# Patient Record
Sex: Female | Born: 1975
Health system: Southern US, Community
[De-identification: ages and names within clinical notes are randomized; demographics above are authoritative.]

## PROBLEM LIST (undated history)

## (undated) DIAGNOSIS — I10 Essential (primary) hypertension: Secondary | ICD-10-CM

## (undated) DIAGNOSIS — R569 Unspecified convulsions: Secondary | ICD-10-CM

## (undated) DIAGNOSIS — F419 Anxiety disorder, unspecified: Secondary | ICD-10-CM

## (undated) HISTORY — PX: NO PAST SURGERIES: SHX2092

## (undated) HISTORY — DX: Anxiety disorder, unspecified: F41.9

---

## 1998-02-27 ENCOUNTER — Inpatient Hospital Stay (HOSPITAL_COMMUNITY): Admission: AD | Admit: 1998-02-27 | Discharge: 1998-02-27 | Payer: Self-pay | Admitting: Obstetrics and Gynecology

## 1998-03-02 ENCOUNTER — Inpatient Hospital Stay (HOSPITAL_COMMUNITY): Admission: AD | Admit: 1998-03-02 | Discharge: 1998-03-02 | Payer: Self-pay | Admitting: Unknown Physician Specialty

## 1998-03-22 ENCOUNTER — Other Ambulatory Visit: Admission: RE | Admit: 1998-03-22 | Discharge: 1998-03-22 | Payer: Self-pay | Admitting: Obstetrics and Gynecology

## 1998-06-18 ENCOUNTER — Emergency Department (HOSPITAL_COMMUNITY): Admission: EM | Admit: 1998-06-18 | Discharge: 1998-06-18 | Payer: Self-pay | Admitting: Family Medicine

## 1998-09-04 ENCOUNTER — Inpatient Hospital Stay (HOSPITAL_COMMUNITY): Admission: AD | Admit: 1998-09-04 | Discharge: 1998-09-04 | Payer: Self-pay | Admitting: Obstetrics and Gynecology

## 1998-09-19 ENCOUNTER — Inpatient Hospital Stay (HOSPITAL_COMMUNITY): Admission: AD | Admit: 1998-09-19 | Discharge: 1998-09-19 | Payer: Self-pay | Admitting: *Deleted

## 1998-09-21 ENCOUNTER — Inpatient Hospital Stay (HOSPITAL_COMMUNITY): Admission: AD | Admit: 1998-09-21 | Discharge: 1998-09-21 | Payer: Self-pay | Admitting: Obstetrics and Gynecology

## 1998-09-22 ENCOUNTER — Inpatient Hospital Stay: Admission: AD | Admit: 1998-09-22 | Discharge: 1998-09-22 | Payer: Self-pay | Admitting: Obstetrics & Gynecology

## 1998-09-24 ENCOUNTER — Inpatient Hospital Stay (HOSPITAL_COMMUNITY): Admission: AD | Admit: 1998-09-24 | Discharge: 1998-09-24 | Payer: Self-pay | Admitting: Obstetrics and Gynecology

## 1998-09-25 ENCOUNTER — Inpatient Hospital Stay (HOSPITAL_COMMUNITY): Admission: AD | Admit: 1998-09-25 | Discharge: 1998-09-28 | Payer: Self-pay | Admitting: Obstetrics & Gynecology

## 1998-09-29 ENCOUNTER — Inpatient Hospital Stay (HOSPITAL_COMMUNITY): Admission: AD | Admit: 1998-09-29 | Discharge: 1998-09-29 | Payer: Self-pay | Admitting: Obstetrics and Gynecology

## 1998-11-01 ENCOUNTER — Other Ambulatory Visit: Admission: RE | Admit: 1998-11-01 | Discharge: 1998-11-01 | Payer: Self-pay | Admitting: Obstetrics and Gynecology

## 1999-01-23 ENCOUNTER — Emergency Department (HOSPITAL_COMMUNITY): Admission: EM | Admit: 1999-01-23 | Discharge: 1999-01-23 | Payer: Self-pay | Admitting: Emergency Medicine

## 1999-11-06 ENCOUNTER — Other Ambulatory Visit: Admission: RE | Admit: 1999-11-06 | Discharge: 1999-11-06 | Payer: Self-pay | Admitting: Obstetrics and Gynecology

## 2000-12-18 ENCOUNTER — Other Ambulatory Visit: Admission: RE | Admit: 2000-12-18 | Discharge: 2000-12-18 | Payer: Self-pay | Admitting: Obstetrics and Gynecology

## 2001-02-23 ENCOUNTER — Other Ambulatory Visit: Admission: RE | Admit: 2001-02-23 | Discharge: 2001-02-23 | Payer: Self-pay | Admitting: Obstetrics and Gynecology

## 2001-06-11 ENCOUNTER — Emergency Department (HOSPITAL_COMMUNITY): Admission: EM | Admit: 2001-06-11 | Discharge: 2001-06-11 | Payer: Self-pay | Admitting: Emergency Medicine

## 2001-10-25 ENCOUNTER — Other Ambulatory Visit: Admission: RE | Admit: 2001-10-25 | Discharge: 2001-10-25 | Payer: Self-pay | Admitting: Obstetrics and Gynecology

## 2001-11-11 ENCOUNTER — Emergency Department (HOSPITAL_COMMUNITY): Admission: EM | Admit: 2001-11-11 | Discharge: 2001-11-11 | Payer: Self-pay | Admitting: Emergency Medicine

## 2002-07-25 ENCOUNTER — Other Ambulatory Visit: Admission: RE | Admit: 2002-07-25 | Discharge: 2002-07-25 | Payer: Self-pay | Admitting: Obstetrics and Gynecology

## 2003-02-21 ENCOUNTER — Encounter: Admission: RE | Admit: 2003-02-21 | Discharge: 2003-02-21 | Payer: Self-pay | Admitting: Cardiology

## 2003-02-21 ENCOUNTER — Encounter: Payer: Self-pay | Admitting: Cardiology

## 2016-12-17 LAB — HM MAMMOGRAPHY

## 2017-07-27 ENCOUNTER — Encounter (HOSPITAL_COMMUNITY): Payer: Self-pay | Admitting: Emergency Medicine

## 2017-07-27 ENCOUNTER — Ambulatory Visit (HOSPITAL_COMMUNITY)
Admission: EM | Admit: 2017-07-27 | Discharge: 2017-07-27 | Disposition: A | Discharge status: Home / Self Care | Payer: Self-pay | Attending: Emergency Medicine | Admitting: Emergency Medicine

## 2017-07-27 DIAGNOSIS — I10 Essential (primary) hypertension: Secondary | ICD-10-CM

## 2017-07-27 DIAGNOSIS — J0110 Acute frontal sinusitis, unspecified: Secondary | ICD-10-CM

## 2017-07-27 HISTORY — DX: Essential (primary) hypertension: I10

## 2017-07-27 MED ORDER — DOXYCYCLINE HYCLATE 100 MG PO CAPS: 100.0000 mg | ORAL_CAPSULE | Freq: Two times a day (BID) | ORAL | 0 refills | Status: AC

## 2017-07-27 MED ORDER — IBUPROFEN 600 MG PO TABS: 600.0000 mg | ORAL_TABLET | Freq: Four times a day (QID) | ORAL | 0 refills | Status: DC | PRN

## 2017-07-27 MED ORDER — FLUTICASONE PROPIONATE 50 MCG/ACT NA SUSP: 2.0000 | Freq: Every day | NASAL | 0 refills | Status: DC

## 2017-07-27 NOTE — ED Provider Notes (Signed)
HPI  SUBJECTIVE:  Faith Oconnor is a 41 y.o. female who presents with 10 days of yellow nasal congestion, rhinorrhea, postnasal drip, frontal headaches, sinus pain or pressure and right ear pain.  She reports a source, scratchy, irritated throat.  She has tried Advil sinus and Benadryl cold and sinus.  The Advil sinus helps.  Symptoms are worse with lying down.  No fevers, facial swelling, upper dental pain.  No change in hearing, otorrhea.  She has a past medical history of hypertension, states that she has not taken her blood pressure medicines today.  States it is normally well controlled.  Also history of anxiety, sinusitis.  This is identical to previous episodes of sinusitis.  No history of diabetes.  LMP: Mirena.  PMD: None.  Past Medical History:  Diagnosis Date  . Hypertension     History reviewed. No pertinent surgical history.  No family history on file.  Social History  Substance Use Topics  . Smoking status: Never Smoker  . Smokeless tobacco: Never Used  . Alcohol use No    No current facility-administered medications for this encounter.   Current Outpatient Prescriptions:  .  lisinopril (PRINIVIL,ZESTRIL) 10 MG tablet, Take 10 mg by mouth daily., Disp: , Rfl:  .  doxycycline (VIBRAMYCIN) 100 MG capsule, Take 1 capsule (100 mg total) by mouth 2 (two) times daily., Disp: 14 capsule, Rfl: 0 .  fluticasone (FLONASE) 50 MCG/ACT nasal spray, Place 2 sprays into both nostrils daily., Disp: 16 g, Rfl: 0 .  ibuprofen (ADVIL,MOTRIN) 600 MG tablet, Take 1 tablet (600 mg total) by mouth every 6 (six) hours as needed., Disp: 30 tablet, Rfl: 0  Allergies  Allergen Reactions  . Penicillins      ROS  As noted in HPI.   Physical Exam  BP (!) 174/88 (BP Location: Right Arm)   Pulse 76   Temp 98.1 F (36.7 C) (Oral)   Resp 18   SpO2 100%   Constitutional: Well developed, well nourished, no acute distress Eyes:  EOMI, conjunctiva normal bilaterally HENT: Normocephalic,  atraumatic,mucus membranes moist. TMs normal bilaterally. + purulent nasal congestion. Swollen red turbinates. - maxillary sinus tenderness, +  frontal sinus tenderness. Oropharynx normal + postnasal drip.  Respiratory: Normal inspiratory effort Cardiovascular: Normal rate GI: nondistended skin: No rash, skin intact Musculoskeletal: no deformities Neurologic: Alert & oriented x 3, no focal neuro deficits Psychiatric: Speech and behavior appropriate   ED Course   Medications - No data to display  No orders of the defined types were placed in this encounter.   No results found for this or any previous visit (from the past 24 hour(s)). No results found.  ED Clinical Impression  Hypertension, unspecified type  Acute frontal sinusitis, recurrence not specified   ED Assessment/Plan   Pt with indications for abx given duration of symptoms. Will start doxycycline in addition to nasal steriods, plain Mucinex, saline nasal irrigation, increase fluids, tylenol/motrin prn pain.  Follow-up with primary care physician of choice, providing primary care referral list.  Discussed MDM and plan with pt. Discussed sn/sx that should prompt return to the  ED. Pt agrees with plan and will f/u with PMD of choice as needed prn.   Pt hypertensive today.  Has not taken BP meds today and has been taking OTC decongestants. Pt has no historical evidence of end organ damage. Pt denies any CNS type sx such as HA, visual changes, focal paresis, or new onset seizure activity. Pt denies any CV sx such  as CP, dyspnea, palpitations, pedal edema, tearing pain radiating to back or abd. Pt denied any renal sx such as anuria or hematuria.  Elevated blood pressure most likely from decongestions, so we will have patient discontinue her over-the-counter cold medications and start on Tylenol/ibuprofen/plain Mucinex/saline nasal irrigation/Flonase.  She will keep a log of her blood pressure, she is to measure it once every day  at the same time, and bring this to her next primary care visit.  Providing primary care referral list.  Gave her strict ER return precautions.   *This clinic note was created using Dragon dictation software. Therefore, there may be occasional mistakes despite careful proofreading.  ?    Domenick GongMortenson, Chazlyn Cude, MD 07/28/17 [phone]

## 2017-07-27 NOTE — Discharge Instructions (Signed)
Take the medication as written.  Stop all other cold medications.  Start plain Mucinex. You may take 600 mg of motrin with 1 gram of tylenol up to 3-4 times a day as needed for pain. This is an effective combination for pain.   Use a neti pot or the NeilMed sinus rinse as often as you want to to reduce nasal congestion. Follow the directions on the box.   Decrease your salt intake. diet and exercise will lower your blood pressure significantly. It is important to keep your blood pressure under good control, as having a elevated for prolonged periods of time significantly increases your risk of stroke, heart attacks, kidney damage, eye damage, and other problems. Measure your blood pressure once a day, preferably at the same time every day. Keep a log of this and bring it to your next doctor's appointment. Return here in two weeks for blood pressure recheck if you're able to find a primary care physician by then. Return immediately to the ER if you start having chest pain, headache, problems seeing, problems talking, problems walking, if you feel like you're about to pass out, if you do pass out, if you have a seizure, or for any other concerns.  Below is a list of primary care practices who are taking new patients for you to follow-up with. [facility] [address] [place] [address] [phone]  [facility] [phone] [address] [place] [address]  [facility]: [address] [place] [id number]  [phone]  [facility]: [address] [place] [id number]   [phone]  [facility]: [address] [place] [address]  [phone]  [facility] : [address] [place] [id number] [phone]  [facility]: [address] [place] [phone]  [facility]: [address] [place] [id number] [phone]  Dr. Oneal GroutMahima Pandey [address] [facility] [place] [id number]  [phone]  Dr. Jackie PlumGeorge Osei-Bonsu, [facility]. [address] [place], [place] [address]  [phone]  Go to www.goodrx.com to look up your medications. This will give you a list of where you can find your prescriptions at the most affordable prices. Or ask the pharmacist what the cash price is, or if they have any other discount programs available to help make your medication more affordable. This can be less expensive than what you would pay with insurance.

## 2017-07-27 NOTE — ED Triage Notes (Signed)
Pt sts nasal congestion x 3 days °

## 2017-11-10 ENCOUNTER — Telehealth: Payer: Self-pay | Admitting: Neurology

## 2017-11-10 NOTE — Telephone Encounter (Signed)
Kathie RhodesBetty or Diane, Dr. Haroldine Lawsrossley would like me to see this patient asap. She is a new referral. Looks like I have a 9am open on this  Thursday, its an emg slot but we can give it to her if she can make it. On February 21st I have a few open emg slots as well. Would you reach out to her please and offer these appointments to her to her?  Thanks   Bethany: fyi

## 2017-11-10 NOTE — Telephone Encounter (Signed)
[name], Dr. Crossley would like me to see this patient asap. She is a new referral. Looks like I have a 9am open on this  Thursday, its an emg slot but we can give it to her if she can make it. On February 21st I have a few open emg slots as well. Would you reach out to her please and offer these appointments to her to her?  Thanks   Bethany: fyi 

## 2017-11-19 ENCOUNTER — Ambulatory Visit: Payer: Self-pay | Admitting: Neurology

## 2017-11-24 ENCOUNTER — Encounter: Payer: Self-pay | Admitting: Neurology

## 2018-03-02 ENCOUNTER — Ambulatory Visit: Payer: Self-pay | Admitting: Physician Assistant

## 2018-03-09 ENCOUNTER — Encounter: Payer: Self-pay | Admitting: Physician Assistant

## 2018-03-09 ENCOUNTER — Ambulatory Visit (INDEPENDENT_AMBULATORY_CARE_PROVIDER_SITE_OTHER): Payer: No Typology Code available for payment source | Admitting: Physician Assistant

## 2018-03-09 ENCOUNTER — Other Ambulatory Visit: Payer: Self-pay

## 2018-03-09 VITALS — BP 140/104 | HR 97 | Temp 98.5°F | Resp 17 | Ht 63.0 in | Wt 192.6 lb

## 2018-03-09 DIAGNOSIS — Z124 Encounter for screening for malignant neoplasm of cervix: Secondary | ICD-10-CM | POA: Diagnosis not present

## 2018-03-09 DIAGNOSIS — I10 Essential (primary) hypertension: Secondary | ICD-10-CM

## 2018-03-09 DIAGNOSIS — Z1231 Encounter for screening mammogram for malignant neoplasm of breast: Secondary | ICD-10-CM | POA: Diagnosis not present

## 2018-03-09 DIAGNOSIS — Z1239 Encounter for other screening for malignant neoplasm of breast: Secondary | ICD-10-CM

## 2018-03-09 DIAGNOSIS — M533 Sacrococcygeal disorders, not elsewhere classified: Secondary | ICD-10-CM | POA: Diagnosis not present

## 2018-03-09 MED ORDER — LISINOPRIL-HYDROCHLOROTHIAZIDE 10-12.5 MG PO TABS: 1.0000 | ORAL_TABLET | Freq: Every day | ORAL | 1 refills | Status: DC

## 2018-03-09 MED ORDER — MELOXICAM 7.5 MG PO TABS: 7.5000 mg | ORAL_TABLET | Freq: Every day | ORAL | 0 refills | Status: DC

## 2018-03-09 MED FILL — LISINOPRIL-HCTZ 10-12.5 MG: 10-12.5 | 30 days supply | Qty: 30 | Fill #0

## 2018-03-09 MED FILL — MELOXICAM 7.5 MG TABLET: 7.5 | 15 days supply | Qty: 15 | Fill #0
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
## 2018-03-09 NOTE — Assessment & Plan Note (Signed)
Declines exam. Order for screening mammogram placed.

## 2018-03-09 NOTE — Assessment & Plan Note (Signed)
BP above goal. Out of medication for > 2 weeks. Asymptomatic. Will restart her Lisinopril-HCTZ at prior dose. Start DASH diet. Recheck BP here in office in 1-2 weeks. Will adjust dose further if needed. 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

## 2018-03-09 NOTE — Patient Instructions (Signed)
Please restart your blood pressure medicine, taking as directed.  Try to follow the dietary recommendations below. We will recheck your BP in office here in a week or so.   Hold off on taking the Meloxicam until you have restarted your BP medications for a few days.  Limit heavy lifting or overexertion. Apply heating pad to the area for 10-15 minutes a few times per day. Get a donut cushion to use when having to sit for prolonged periods of time.   You will be contacted to schedule your mammogram. You will also be contacted to schedule an appointment with [facility].    DASH Eating Plan DASH stands for "Dietary Approaches to Stop Hypertension." The DASH eating plan is a healthy eating plan that has been shown to reduce high blood pressure (hypertension). It may also reduce your risk for type 2 diabetes, heart disease, and stroke. The DASH eating plan may also help with weight loss. What are tips for following this plan? General guidelines  Avoid eating more than 2,300 mg (milligrams) of salt (sodium) a day. If you have hypertension, you may need to reduce your sodium intake to 1,500 mg a day.  Limit alcohol intake to no more than 1 drink a day for nonpregnant women and 2 drinks a day for men. One drink equals 12 oz of beer, 5 oz of wine, or 1 oz of hard liquor.  Work with your health care provider to maintain a healthy body weight or to lose weight. Ask what an ideal weight is for you.  Get at least 30 minutes of exercise that causes your heart to beat faster (aerobic exercise) most days of the week. Activities may include walking, swimming, or biking.  Work with your health care provider or diet and nutrition specialist (dietitian) to adjust your eating plan to your individual calorie needs. Reading food labels  Check food labels for the amount of sodium per serving. Choose foods with less than 5 percent of the Daily Value of sodium. Generally, foods with less than 300 mg of  sodium per serving fit into this eating plan.  To find whole grains, look for the word "whole" as the first word in the ingredient list. Shopping  Buy products labeled as "low-sodium" or "no salt added."  Buy fresh foods. Avoid canned foods and premade or frozen meals. Cooking  Avoid adding salt when cooking. Use salt-free seasonings or herbs instead of table salt or sea salt. Check with your health care provider or pharmacist before using salt substitutes.  Do not fry foods. Cook foods using healthy methods such as baking, boiling, grilling, and broiling instead.  Cook with heart-healthy oils, such as olive, canola, soybean, or sunflower oil. Meal planning   Eat a balanced diet that includes: ? 5 or more servings of fruits and vegetables each day. At each meal, try to fill half of your plate with fruits and vegetables. ? Up to 6-8 servings of whole grains each day. ? Less than 6 oz of lean meat, poultry, or fish each day. A 3-oz serving of meat is about the same size as a deck of cards. One egg equals 1 oz. ? 2 servings of low-fat dairy each day. ? A serving of nuts, seeds, or beans 5 times each week. ? Heart-healthy fats. Healthy fats called Omega-3 fatty acids are found in foods such as flaxseeds and coldwater fish, like sardines, salmon, and mackerel.  Limit how much you eat of the following: ? Canned or  prepackaged foods. ? Food that is high in trans fat, such as fried foods. ? Food that is high in saturated fat, such as fatty meat. ? Sweets, desserts, sugary drinks, and other foods with added sugar. ? Full-fat dairy products.  Do not salt foods before eating.  Try to eat at least 2 vegetarian meals each week.  Eat more home-cooked food and less restaurant, buffet, and fast food.  When eating at a restaurant, ask that your food be prepared with less salt or no salt, if possible. What foods are recommended? The items listed may not be a complete list. Talk with your  dietitian about what dietary choices are best for you. Grains Whole-grain or whole-wheat bread. Whole-grain or whole-wheat pasta. Brown rice. [place]. Bulgur. Whole-grain and low-sodium cereals. Pita bread. Low-fat, low-sodium crackers. Whole-wheat flour tortillas. Vegetables Fresh or frozen vegetables (raw, steamed, roasted, or grilled). Low-sodium or reduced-sodium tomato and vegetable juice. Low-sodium or reduced-sodium tomato sauce and tomato paste. Low-sodium or reduced-sodium canned vegetables. Fruits All fresh, dried, or frozen fruit. Canned fruit in natural juice (without added sugar). Meat and other protein foods Skinless chicken or [place]. Ground chicken or [place]. Pork with fat trimmed off. Fish and seafood. Egg whites. Dried beans, peas, or lentils. Unsalted nuts, nut butters, and seeds. Unsalted canned beans. Lean cuts of beef with fat trimmed off. Low-sodium, lean deli meat. Dairy Low-fat (1%) or fat-free (skim) milk. Fat-free, low-fat, or reduced-fat cheeses. Nonfat, low-sodium ricotta or cottage cheese. Low-fat or nonfat yogurt. Low-fat, low-sodium cheese. Fats and oils Soft margarine without trans fats. Vegetable oil. Low-fat, reduced-fat, or light mayonnaise and salad dressings (reduced-sodium). Canola, safflower, olive, soybean, and sunflower oils. Avocado. Seasoning and other foods Herbs. Spices. Seasoning mixes without salt. Unsalted popcorn and pretzels. Fat-free sweets. What foods are not recommended? The items listed may not be a complete list. Talk with your dietitian about what dietary choices are best for you. Grains Baked goods made with fat, such as croissants, muffins, or some breads. Dry pasta or rice meal packs. Vegetables Creamed or fried vegetables. Vegetables in a cheese sauce. Regular canned vegetables (not low-sodium or reduced-sodium). Regular canned tomato sauce and paste (not low-sodium or reduced-sodium). Regular tomato and vegetable juice (not  low-sodium or reduced-sodium). Angie Fava. Olives. Fruits Canned fruit in a light or heavy syrup. Fried fruit. Fruit in cream or butter sauce. Meat and other protein foods Fatty cuts of meat. Ribs. Fried meat. Berniece Salines. Sausage. Bologna and other processed lunch meats. Salami. Fatback. Hotdogs. Bratwurst. Salted nuts and seeds. Canned beans with added salt. Canned or smoked fish. Whole eggs or egg yolks. Chicken or [place] with skin. Dairy Whole or 2% milk, cream, and half-and-half. Whole or full-fat cream cheese. Whole-fat or sweetened yogurt. Full-fat cheese. Nondairy creamers. Whipped toppings. Processed cheese and cheese spreads. Fats and oils Butter. Stick margarine. Lard. Shortening. Ghee. Bacon fat. Tropical oils, such as coconut, palm kernel, or palm oil. Seasoning and other foods Salted popcorn and pretzels. Onion salt, garlic salt, seasoned salt, table salt, and sea salt. Worcestershire sauce. Tartar sauce. Barbecue sauce. Teriyaki sauce. Soy sauce, including reduced-sodium. Steak sauce. Canned and packaged gravies. Fish sauce. Oyster sauce. Cocktail sauce. Horseradish that you find on the shelf. Ketchup. Mustard. Meat flavorings and tenderizers. Bouillon cubes. Hot sauce and Tabasco sauce. Premade or packaged marinades. Premade or packaged taco seasonings. Relishes. Regular salad dressings. Where to find more information:  National Heart, Lung, and [place]: https://wilson-eaton.com/  American Heart Association: www.heart.org Summary  The DASH  eating plan is a healthy eating plan that has been shown to reduce high blood pressure (hypertension). It may also reduce your risk for type 2 diabetes, heart disease, and stroke.  With the DASH eating plan, you should limit salt (sodium) intake to 2,300 mg a day. If you have hypertension, you may need to reduce your sodium intake to 1,500 mg a day.  When on the DASH eating plan, aim to eat more fresh fruits and vegetables, whole grains, lean proteins,  low-fat dairy, and heart-healthy fats.  Work with your health care provider or diet and nutrition specialist (dietitian) to adjust your eating plan to your individual calorie needs. This information is not intended to replace advice given to you by your health care provider. Make sure you discuss any questions you have with your health care provider. Document Released: 09/04/2011 Document Revised: 09/08/2016 Document Reviewed: 09/08/2016 Elsevier Interactive Patient Education  [employer].

## 2018-03-09 NOTE — Assessment & Plan Note (Signed)
Referral to [facility] placed per patient preference for routine care and PAP smear.

## 2018-03-19 ENCOUNTER — Other Ambulatory Visit: Payer: Self-pay | Admitting: Physician Assistant

## 2018-03-19 DIAGNOSIS — Z7689 Persons encountering health services in other specified circumstances: Secondary | ICD-10-CM

## 2018-03-25 ENCOUNTER — Other Ambulatory Visit: Payer: Self-pay | Admitting: Physician Assistant

## 2018-03-25 MED ORDER — FLUCONAZOLE 150 MG PO TABS: 150.0000 mg | ORAL_TABLET | Freq: Once | ORAL | 0 refills | Status: AC

## 2018-03-26 ENCOUNTER — Other Ambulatory Visit: Payer: Self-pay | Admitting: Physician Assistant

## 2018-03-26 MED ORDER — CELECOXIB 100 MG PO CAPS: 100.0000 mg | ORAL_CAPSULE | Freq: Two times a day (BID) | ORAL | 0 refills | Status: DC

## 2018-03-30 ENCOUNTER — Telehealth: Payer: No Typology Code available for payment source | Admitting: Nurse Practitioner

## 2018-03-30 DIAGNOSIS — B9789 Other viral agents as the cause of diseases classified elsewhere: Secondary | ICD-10-CM

## 2018-03-30 DIAGNOSIS — J329 Chronic sinusitis, unspecified: Secondary | ICD-10-CM

## 2018-03-30 MED ORDER — FLUTICASONE PROPIONATE 50 MCG/ACT NA SUSP: 2.0000 | Freq: Every day | NASAL | 0 refills | Status: DC

## 2018-03-30 NOTE — Progress Notes (Signed)

## 2018-03-31 MED FILL — NAPROXEN 500 MG TABLET: 500 | 10 days supply | Qty: 20 | Fill #0

## 2018-03-31 MED FILL — CYCLOBENZAPRINE 10 MG TAB: 10 | 10 days supply | Qty: 20 | Fill #0

## 2018-04-06 ENCOUNTER — Encounter: Payer: Self-pay | Admitting: Emergency Medicine

## 2018-04-22 ENCOUNTER — Encounter: Payer: Self-pay | Admitting: Physician Assistant

## 2018-04-26 NOTE — Telephone Encounter (Signed)
Ok to phone in refill of patient's lorazepam 0.5 mg tablet. Take 1 tablet by mouth up to BID PRN. Quantity 60 with 0 refills.

## 2018-04-29 MED ORDER — LORAZEPAM 0.5 MG PO TABS: 0.5000 mg | ORAL_TABLET | Freq: Two times a day (BID) | ORAL | 1 refills | Status: DC

## 2018-04-29 NOTE — Addendum Note (Signed)
Addended by: Waldon MerlMARTIN, Merci Walthers C on: 04/29/2018 11:24 AM   Modules accepted: Orders

## 2018-05-17 ENCOUNTER — Encounter (HOSPITAL_COMMUNITY): Payer: Self-pay | Admitting: Emergency Medicine

## 2018-05-17 ENCOUNTER — Ambulatory Visit (HOSPITAL_COMMUNITY)
Admission: EM | Admit: 2018-05-17 | Discharge: 2018-05-17 | Disposition: A | Discharge status: Home / Self Care | Payer: No Typology Code available for payment source | Attending: Family Medicine | Admitting: Family Medicine

## 2018-05-17 DIAGNOSIS — Z0289 Encounter for other administrative examinations: Secondary | ICD-10-CM

## 2018-05-17 DIAGNOSIS — S39012S Strain of muscle, fascia and tendon of lower back, sequela: Secondary | ICD-10-CM | POA: Diagnosis not present

## 2018-05-17 DIAGNOSIS — S39012D Strain of muscle, fascia and tendon of lower back, subsequent encounter: Secondary | ICD-10-CM

## 2018-05-17 HISTORY — DX: Strain of muscle, fascia and tendon of lower back, subsequent encounter: S39.012D

## 2018-05-17 NOTE — ED Provider Notes (Signed)
[facility]    CSN: [id number] Arrival date & time: 05/17/18  1221     History   Chief Complaint Chief Complaint  Patient presents with  . Letter for School/Work    HPI Faith Oconnor is a 42 y.o. female.   Patient is here today for a note to return to work after injuring her back about 6 months ago.  She was seen by medical provider at that time and told she had low back strain.  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  HPI  Past Medical History:  Diagnosis Date  . Anxiety   . Hypertension     Patient Active Problem List   Diagnosis Date Noted  . Essential hypertension 03/09/2018  . Breast cancer screening 03/09/2018  . Cervical cancer screening 03/09/2018  . Sacrodynia 03/09/2018    Past Surgical History:  Procedure Laterality Date  . NO PAST SURGERIES      OB History   None      Home Medications    Prior to Admission medications   Medication Sig Start Date End Date Taking? Authorizing Provider  celecoxib (CELEBREX) 100 MG capsule Take 1 capsule (100 mg total) by mouth 2 (two) times daily. 03/26/18   Waldon MerlMartin, William C, PA-C  cyclobenzaprine (FLEXERIL) 10 MG tablet Take by mouth. 02/06/18   [provider]  fluticasone (FLONASE) 50 MCG/ACT nasal spray Place 2 sprays into both nostrils daily. 03/30/18   Bennie PieriniMartin, Mary-Margaret, FNP  levonorgestrel (MIRENA) 20 MCG/24HR IUD 1 each by Intrauterine route once.    [provider]  lisinopril-hydrochlorothiazide (PRINZIDE,ZESTORETIC) 10-12.5 MG tablet Take 1 tablet by mouth daily. 03/09/18   Waldon MerlMartin, William C, PA-C  LORazepam (ATIVAN) 0.5 MG tablet Take 1 tablet (0.5 mg total) by mouth 2 (two) times daily. 04/29/18   Waldon MerlMartin, William C, PA-C    Family History Family History  Problem Relation Age of Onset  . Hypertension Mother   . Osteoarthritis Mother   . Hyperlipidemia Mother   . Hypertension Father   . Diabetes Mellitus II Father   .  Hyperlipidemia Father   . Hypertension Sister   . Sleep apnea Sister   . Depression Sister   . Breast cancer Maternal Grandmother 20  . Breast cancer Maternal Aunt 30       Metastasized to her bones  . Pancreatic cancer Maternal Aunt   . Depression Sister   . Hypertension Sister   . Epilepsy Daughter   . Migraines Daughter     Social History Social History   Tobacco Use  . Smoking status: Never Smoker  . Smokeless tobacco: Never Used  Substance Use Topics  . Alcohol use: No  . Drug use: No     Allergies   Penicillins   Review of Systems Review of Systems  Constitutional: Negative for chills and fever.  HENT: Negative for ear pain and sore throat.   Eyes: Negative for pain and visual disturbance.  Respiratory: Negative for cough and shortness of breath.   Cardiovascular: Negative for chest pain and palpitations.  Gastrointestinal: Negative for abdominal pain and vomiting.  Genitourinary: Negative for dysuria and hematuria.  Musculoskeletal: Negative for arthralgias and back pain.  Skin: Negative for color change and rash.  Neurological: Negative for seizures and syncope.  All other systems reviewed and are negative.    Physical Exam Triage Vital Signs ED Triage Vitals [05/17/18 1250]  Enc Vitals Group     BP 134/90  Pulse Rate 83     Resp 18     Temp 98.4 F (36.9 C)     Temp Source Oral     SpO2 100 %     Weight      Height      Head Circumference      Peak Flow      Pain Score      Pain Loc      Pain Edu?      Excl. in GC?    No data found.  Updated Vital Signs BP 134/90 (BP Location: Left Arm)   Pulse 83   Temp 98.4 F (36.9 C) (Oral)   Resp 18   SpO2 100%   Visual Acuity Right Eye Distance:   Left Eye Distance:   Bilateral Distance:    Right Eye Near:   Left Eye Near:    Bilateral Near:     Physical Exam  Constitutional: She appears well-developed and well-nourished.  Musculoskeletal: Normal range of motion.  Patient has  normal range of motion in her back. Deep tendon reflexes are symmetric at knees and ankles Straight leg raising is negative There is no tenderness palpation of spine. Strength is appropriate 5+ and equal both lower extremities.     UC Treatments / Results  Labs (all labs ordered are listed, but only abnormal results are displayed) Labs Reviewed - No data to display  EKG None  Radiology No results found.  Procedures Procedures (including critical care time)  Medications Ordered in UC Medications - No data to display  Initial Impression / Assessment and Plan / UC Course  I have reviewed the triage vital signs and the nursing notes.  Pertinent labs & imaging results that were available during my care of the patient were reviewed by me and considered in my medical decision making (see chart for details).     History of low back strain.  Patient cleared to return to work Final Clinical Impressions(s) / UC Diagnoses   Final diagnoses:  None   Discharge Instructions   None    ED Prescriptions    None     Controlled Substance Prescriptions [place] Controlled Substance Registry consulted? No   Frederica KusterMiller, Camiya Vinal M, MD 05/17/18 1259

## 2018-05-17 NOTE — ED Triage Notes (Signed)
Pt requesting note to return to work; pt has been out approx 6 months

## 2018-07-28 ENCOUNTER — Encounter: Payer: Self-pay | Admitting: Physician Assistant

## 2018-07-28 MED ORDER — CELECOXIB 100 MG PO CAPS: 100.0000 mg | ORAL_CAPSULE | Freq: Two times a day (BID) | ORAL | 0 refills | Status: DC

## 2018-07-28 MED ORDER — LORAZEPAM 0.5 MG PO TABS
0.5000 mg | ORAL_TABLET | Freq: Two times a day (BID) | ORAL | 1 refills | Status: DC
Start: 2018-07-28 — End: 2019-07-13

## 2019-02-08 ENCOUNTER — Encounter: Payer: Self-pay | Admitting: Emergency Medicine

## 2019-02-08 ENCOUNTER — Other Ambulatory Visit: Payer: Self-pay | Admitting: Physician Assistant

## 2019-02-08 NOTE — Telephone Encounter (Signed)
My chart message sent to patient advising of needing an appointment.

## 2019-03-12 ENCOUNTER — Emergency Department (HOSPITAL_COMMUNITY)
Admission: EM | Admit: 2019-03-12 | Discharge: 2019-03-12 | Disposition: A | Discharge status: Home / Self Care | Payer: BLUE CROSS/BLUE SHIELD | Attending: Emergency Medicine | Admitting: Emergency Medicine

## 2019-03-12 ENCOUNTER — Encounter (HOSPITAL_COMMUNITY): Payer: Self-pay | Admitting: Emergency Medicine

## 2019-03-12 ENCOUNTER — Emergency Department (HOSPITAL_COMMUNITY): Payer: BLUE CROSS/BLUE SHIELD

## 2019-03-12 ENCOUNTER — Other Ambulatory Visit: Payer: Self-pay

## 2019-03-12 DIAGNOSIS — F419 Anxiety disorder, unspecified: Secondary | ICD-10-CM | POA: Diagnosis not present

## 2019-03-12 DIAGNOSIS — R569 Unspecified convulsions: Secondary | ICD-10-CM | POA: Insufficient documentation

## 2019-03-12 DIAGNOSIS — I10 Essential (primary) hypertension: Secondary | ICD-10-CM | POA: Diagnosis not present

## 2019-03-12 LAB — CBC WITH DIFFERENTIAL/PLATELET
Abs Immature Granulocytes: 0.03 10*3/uL (ref 0.00–0.07)
Basophils Absolute: 0 10*3/uL (ref 0.0–0.1)
Basophils Relative: 0 %
Eosinophils Absolute: 0.2 10*3/uL (ref 0.0–0.5)
Eosinophils Relative: 2 %
HCT: 43.3 % (ref 36.0–46.0)
Hemoglobin: 13.4 g/dL (ref 12.0–15.0)
Immature Granulocytes: 0 %
Lymphocytes Relative: 29 %
Lymphs Abs: 2.2 10*3/uL (ref 0.7–4.0)
MCH: 26 pg (ref 26.0–34.0)
MCHC: 30.9 g/dL (ref 30.0–36.0)
MCV: 84.1 fL (ref 80.0–100.0)
Monocytes Absolute: 0.7 10*3/uL (ref 0.1–1.0)
Monocytes Relative: 9 %
Neutro Abs: 4.5 10*3/uL (ref 1.7–7.7)
Neutrophils Relative %: 60 %
Platelets: 269 10*3/uL (ref 150–400)
RBC: 5.15 MIL/uL — ABNORMAL HIGH (ref 3.87–5.11)
RDW: 13.5 % (ref 11.5–15.5)
WBC: 7.7 10*3/uL (ref 4.0–10.5)
nRBC: 0 % (ref 0.0–0.2)

## 2019-03-12 LAB — BASIC METABOLIC PANEL
Anion gap: 14 (ref 5–15)
BUN: 10 mg/dL (ref 6–20)
CO2: 19 mmol/L — ABNORMAL LOW (ref 22–32)
Calcium: 8.9 mg/dL (ref 8.9–10.3)
Chloride: 105 mmol/L (ref 98–111)
Creatinine, Ser: 1.18 mg/dL — ABNORMAL HIGH (ref 0.44–1.00)
GFR calc Af Amer: >60 mL/min (ref >60)
GFR calc non Af Amer: 56 mL/min — ABNORMAL LOW (ref >60)
Glucose, Bld: 86 mg/dL (ref 70–99)
Potassium: 3.3 mmol/L — ABNORMAL LOW (ref 3.5–5.1)
Sodium: 138 mmol/L (ref 135–145)

## 2019-03-12 LAB — I-STAT BETA HCG BLOOD, ED (MC, WL, AP ONLY): I-stat hCG, quantitative: <5 m[IU]/mL (ref <5)

## 2019-03-12 LAB — TSH: TSH: 0.824 u[IU]/mL (ref 0.350–4.500)

## 2019-03-12 LAB — CBG MONITORING, ED: Glucose-Capillary: 73 mg/dL (ref 70–99)

## 2019-03-12 LAB — PHOSPHORUS: Phosphorus: 2.2 mg/dL — ABNORMAL LOW (ref 2.5–4.6)

## 2019-03-12 LAB — D-DIMER, QUANTITATIVE: D-Dimer, Quant: 0.4 ug/mL-FEU (ref 0.00–0.50)

## 2019-03-12 LAB — MAGNESIUM: Magnesium: 1.9 mg/dL (ref 1.7–2.4)

## 2019-03-12 MED ORDER — SODIUM CHLORIDE 0.9 % IV BOLUS
1000.0000 mL | Freq: Once | INTRAVENOUS | Status: AC
  Administered 2019-03-12: 1000 mL via INTRAVENOUS

## 2019-03-12 MED ORDER — LEVETIRACETAM 500 MG PO TABS: 500.0000 mg | ORAL_TABLET | Freq: Two times a day (BID) | ORAL | 0 refills | Status: DC

## 2019-03-12 MED ORDER — LORAZEPAM 2 MG/ML IJ SOLN
1.0000 mg | Freq: Once | INTRAMUSCULAR | Status: AC
  Administered 2019-03-12: 1 mg via INTRAVENOUS
  Filled 2019-03-12: qty 1

## 2019-03-12 MED ORDER — LEVETIRACETAM IN NACL 1000 MG/100ML IV SOLN
1000.0000 mg | Freq: Once | INTRAVENOUS | Status: AC
  Administered 2019-03-12: 1000 mg via INTRAVENOUS
  Filled 2019-03-12: qty 100

## 2019-03-12 NOTE — ED Notes (Signed)
Updated patients husband on pt status.

## 2019-03-12 NOTE — ED Provider Notes (Signed)
[facility] Provider Note   CSN: [id number] Arrival date & time: 03/12/19  1424     History   Chief Complaint Chief Complaint  Patient presents with   Seizures    HPI Faith Oconnor is a 43 y.o. female past medical history of anxiety on prn ativan, hypertension presents emergency department today with chief complaint of seizure.  Onset was acute happening just prior to arrival.  Patient's husband states they were sitting in the drive-through at [facility] when she started to have full body shaking and then slumped over, he estimated this lasted a few minutes. EMS was called and on their arrival patient was alert and oriented to self only, she thought year was 2014 and that she was in a clinic. EMS did not give medications prior to ED arrival.   Pt reports history of seizures at age 65. She has not been on seizure medications since then or seen neurology. She does not remember the last time she took ativan, stating she only takes it as needed.    Past Medical History:  Diagnosis Date   Anxiety    Hypertension     Patient Active Problem List   Diagnosis Date Noted   Low back strain, subsequent encounter 05/17/2018   Essential hypertension 03/09/2018   Breast cancer screening 03/09/2018   Cervical cancer screening 03/09/2018   Sacrodynia 03/09/2018    Past Surgical History:  Procedure Laterality Date   NO PAST SURGERIES       OB History   No obstetric history on file.      Home Medications    Prior to Admission medications   Medication Sig Start Date End Date Taking? Authorizing Provider  celecoxib (CELEBREX) 100 MG capsule Take 1 capsule (100 mg total) by mouth 2 (two) times daily. 07/28/18   Brunetta Jeans, PA-C  cyclobenzaprine (FLEXERIL) 10 MG tablet Take by mouth. 02/06/18   [provider]  fluticasone (FLONASE) 50 MCG/ACT nasal spray Place 2 sprays into both nostrils daily. 03/30/18   Chevis Pretty, FNP  levonorgestrel (MIRENA) 20 MCG/24HR IUD 1 each by Intrauterine route once.    [provider]  lisinopril-hydrochlorothiazide (PRINZIDE,ZESTORETIC) 10-12.5 MG tablet Take 1 tablet by mouth daily. 03/09/18   Brunetta Jeans, PA-C  LORazepam (ATIVAN) 0.5 MG tablet Take 1 tablet (0.5 mg total) by mouth 2 (two) times daily. 07/28/18   Brunetta Jeans, PA-C    Family History Family History  Problem Relation Age of Onset   Hypertension Mother    Osteoarthritis Mother    Hyperlipidemia Mother    Hypertension Father    Diabetes Mellitus II Father    Hyperlipidemia Father    Hypertension Sister    Sleep apnea Sister    Depression Sister    Breast cancer Maternal Grandmother 20   Breast cancer Maternal Aunt 87       Metastasized to her bones   Pancreatic cancer Maternal Aunt    Depression Sister    Hypertension Sister    Epilepsy Daughter    Migraines Daughter     Social History Social History   Tobacco Use   Smoking status: Never Smoker   Smokeless tobacco: Never Used  Substance Use Topics   Alcohol use: No   Drug use: No     Allergies   Penicillins   Review of Systems Review of Systems  Constitutional: Negative for chills and fever.  HENT: Negative for congestion, ear discharge, ear pain, sinus pressure,  sinus pain and sore throat.   Eyes: Negative for pain and redness.  Respiratory: Negative for cough and shortness of breath.   Cardiovascular: Negative for chest pain.  Gastrointestinal: Negative for abdominal pain, constipation, diarrhea, nausea and vomiting.  Genitourinary: Negative for dysuria and hematuria.  Musculoskeletal: Negative for back pain and neck pain.  Skin: Negative for wound.  Neurological: Positive for seizures. Negative for weakness, numbness and headaches.     Physical Exam Updated Vital Signs BP (!) 159/101    Pulse (!) 106    Temp 98.5 F (36.9 C) (Oral)    Resp (!) 26    SpO2 98%    Physical Exam Vitals signs and nursing note reviewed.  Constitutional:      General: She is not in acute distress.    Appearance: She is not ill-appearing.  HENT:     Head: Normocephalic and atraumatic.     Right Ear: Tympanic membrane and external ear normal.     Left Ear: Tympanic membrane and external ear normal.     Nose: Nose normal.     Mouth/Throat:     Mouth: Mucous membranes are moist.     Pharynx: Oropharynx is clear.   Eyes:     General: No scleral icterus.       Right eye: No discharge.        Left eye: No discharge.     Extraocular Movements: Extraocular movements intact.     Conjunctiva/sclera: Conjunctivae normal.     Pupils: Pupils are equal, round, and reactive to light.  Neck:     Musculoskeletal: Normal range of motion.     Vascular: No JVD.  Cardiovascular:     Rate and Rhythm: Regular rhythm. Tachycardia present.     Pulses: Normal pulses.          Radial pulses are 2+ on the right side and 2+ on the left side.     Heart sounds: Normal heart sounds.  Pulmonary:     Comments: Lungs clear to auscultation in all fields. Symmetric chest rise. No wheezing, rales, or rhonchi. Abdominal:     Comments: Abdomen is soft, non-distended, and non-tender in all quadrants. No rigidity, no guarding. No peritoneal signs.  Musculoskeletal: Normal range of motion.     Comments: No midline tenderness, full ROM of all extremities.  Skin:    General: Skin is warm and dry.     Capillary Refill: Capillary refill takes less than 2 seconds.  Neurological:     Mental Status: She is oriented to person, place, and time.     GCS: GCS eye subscore is 4. GCS verbal subscore is 5. GCS motor subscore is 6.     Comments: Pt is alert and oriented to self and year.  Speech is clear and goal oriented, follows commands CN III-XII intact, no facial droop Normal strength in upper and lower extremities bilaterally including dorsiflexion and plantar flexion, strong and equal grip strength.  Sensation normal to light and sharp touch Moves extremities without ataxia, coordination intact Normal finger to nose and rapid alternating movements    Psychiatric:        Behavior: Behavior normal.      ED Treatments / Results  Labs (all labs ordered are listed, but only abnormal results are displayed) Labs Reviewed  BASIC METABOLIC PANEL  CBC WITH DIFFERENTIAL/PLATELET  CBG MONITORING, ED  I-STAT BETA HCG BLOOD, ED ([facility], [facility], [facility] ONLY)    EKG    Radiology No results found.  Procedures Procedures (including critical care time)  Medications Ordered in ED Medications  sodium chloride 0.9 % bolus 1,000 mL (has no administration in time range)     Initial Impression / Assessment and Plan / ED Course  I have reviewed the triage vital signs and the nursing notes.  Pertinent labs & imaging results that were available during my care of the patient were reviewed by me and considered in my medical decision making (see chart for details).  43 yo female presents after witnessed seizure. On arrival she is tachycardic and hypertensive. She is alert to self and year only, bite marks on tongue. 1 mg ativan given as suspect withdrawal. Ct head ordered. Labs show elevated creatinine of 1.18, do not have prior to compare. CBC unremarkable.   Patient care transferred to A. Harris PA-C at the end of my shift. Patient presentation, ED course, and plan of care discussed with review of all pertinent labs and imaging. Please see her note for further details regarding further ED course and disposition. If head CT negative and vitals improve, suspect pt will be disharged home with seizure precautions and neurology follow up.   Final Clinical Impressions(s) / ED Diagnoses   Final diagnoses:  None    ED Discharge Orders    None       Sherene Sireslbrizze, Jailynn Lavalais E, PA-C 03/12/19 1609    Margarita Grizzleay, Danielle, MD 03/13/19 1345

## 2019-03-12 NOTE — ED Notes (Signed)
Pt discharged with all belongings. Discharge instructions reviewed with pt, and pt verbalized understanding. Opportunity for questions provided.  

## 2019-03-12 NOTE — ED Triage Notes (Addendum)
Pt here c/o first time seizure while sitting  In her car waiting on food , pt arrived alert and oriented times 1

## 2019-03-12 NOTE — ED Notes (Signed)
Patient transported to CT 

## 2019-03-12 NOTE — ED Notes (Signed)
Pt ambulated to bathroom with assistance.

## 2019-03-12 NOTE — ED Provider Notes (Signed)
Seizure today at [facility] in car at drive through Lasted a few minutes- tonic clonic - post ictal Tongue biting. hx of seizure at 43 years of age.  Awaiting  A CT head.   60:75 PM   43 year old female with new onset seizure.  Patient does take Ativan for anxiety but states that she only takes it about once a week if she has an anxiety attack.  She did take it today just prior to the onset of her seizure. As I went to discuss the findings with the patient and question her medication usage noticed that she continues to be tachycardic and on the monitor was tachycardic to 121.  I confirmed this with a manual pulse.  I discussed the case with Dr. Darl Householder.  The patient will get another liter bolus of fluid.  We will check a d-dimer and a TSH level.   Clinical Course as of Mar 11 2257  Sat Mar 12, 2019  1920 Spoke with Dr. Cheral Marker about this patient who suggest that we add on a magnesium and a phosphorus level as well as a 2 g Keppra loading dose.  Should her vitals normalized she can be discharged with 500 mg Keppra twice daily, outpatient neuro follow-up with MRI and EEG.   [AH]    Clinical Course User Index [AH] Margarita Mail, PA-C     Patient with mildly decreased phosphorus level.  I discussed this with Dr. Cheral Marker he states that this has not been the low enough to cause her seizures.  After Keppra loading dose, 2 L of fluids patient's vital signs have normalized.  She is feeling much better.  She will be started on Keppra 500 mg twice daily.  She will need outpatient follow-up with neurology and I have given her the information.  She was given [place] driving restriction precautions for her seizure and understands that she is unable to drive for the next 6 months.  Patient appears appropriate for discharge at this time.      Margarita Mail, PA-C 03/12/19 2258    Drenda Freeze, MD 03/13/19 1520

## 2019-03-12 NOTE — Discharge Instructions (Addendum)
Please begin taking this antiseizure medication called Keppra.  You will need to follow-up with a neurologist for outpatient EEG testing and MRI. Please do not drive for the next 6 months or until you are cleared by your neurologist per [facility] guidelines. Please make sure to get plenty of sleep, reduce your stress levels.  Get help right away if: You have a seizure that: Lasts longer than 5 minutes. Is different than previous seizures. Leaves you unable to speak or use a part of your body. Makes it harder to breathe. You have: A seizure after a head injury. Multiple seizures in a row. Confusion or a severe headache right after a seizure. You are having seizures more often. You do not wake up immediately after a seizure. You injure yourself during a seizure.    Department of Motor Vehicle [facility]) of [place] regulations for seizures - It is the patients responsibility to report the incidence of the seizure in the state of [place]. [place] has no statutory provision requiring physicians to report patients diagnosed with epilepsy or seizures to a central state agency.  The recommended DMV regulation requirement for a driver in  for an individual with a seizure is that they be seizure-free for 6-12 months. However, the DMV may consider the following exceptions to this general rule where: (1) a physician-directed change in medication causes a seizure and the individual immediately resumes the previous therapy which controlled seizures; (2) there is a history of nocturnal seizures or seizures which do not involve loss of consciousness, loss of control of motor function, or loss of appropriate sensation and information process; and (3) an individual has a seizure disorder preceded by an aura (warning) lasting 2-3 minutes. While the [facility] may also give consideration to other unusual circumstances which may affect the general requirement that drivers be seizure-free for 6-12 months, interpretation of  these circumstances and assignment of restrictions is at the discretion of the Medical Advisor. The DMV also considers compliance with medical therapy essential for safe driving. [facility] [place] Physician's Guide to [employer] (June, 1995 ed.)] The Department learns of an individual's condition by inquiring on the application form or renewal form, a physician's report to the [facility], an accident report or from correspondence from the individual. The person may be required to submit a Medical Report Form either annually or semi-annually.  Do not drive, swim, take baths or do any other activities that would be dangerous if you had another seizure.   Contact a health care provider if: You have another seizure. You have seizures more often. Your seizure symptoms change. You continue to have seizures with treatment. You have symptoms of an infection or illness. They might increase your risk of having a seizure. Get help right away if: You have a seizure: That lasts longer than 5 minutes. That is different than previous seizures. That leaves you unable to speak or use a part of your body. That makes it harder to breathe. After a head injury. You have: Multiple seizures in a row. Confusion or a severe headache right after a seizure. You are having seizures more often. You do not wake up immediately after a seizure. You injure yourself during a seizure.

## 2019-03-12 NOTE — ED Notes (Signed)
Pt ambulated to bathroom with stand by assist.  

## 2019-03-14 ENCOUNTER — Telehealth: Payer: Self-pay | Admitting: Neurology

## 2019-03-14 NOTE — Telephone Encounter (Signed)
°  Due to current COVID 19 pandemic, our office is severely reducing in office visits until further notice, in order to minimize the risk to our patients and healthcare providers.    Called patient and scheduled a virtual visit with Dr. Krista Blue for 6/18. Patient verbalized understanding of the doxy.me process and I have sent an e-mail to [email or web address] with link and instructions as well as my name and our office number. Patient understands that they will receive a call from RN to update chart.   Pt understands that although there may be some limitations with this type of visit, we will take all precautions to reduce any security or privacy concerns.  Pt understands that this will be treated like an in office visit and we will file with pt's insurance, and there may be a patient responsible charge related to this service.

## 2019-03-17 ENCOUNTER — Ambulatory Visit (INDEPENDENT_AMBULATORY_CARE_PROVIDER_SITE_OTHER): Payer: BLUE CROSS/BLUE SHIELD | Admitting: Neurology

## 2019-03-17 ENCOUNTER — Other Ambulatory Visit: Payer: Self-pay

## 2019-03-17 ENCOUNTER — Encounter: Payer: Self-pay | Admitting: Neurology

## 2019-03-17 DIAGNOSIS — R569 Unspecified convulsions: Secondary | ICD-10-CM

## 2019-03-17 MED ORDER — LEVETIRACETAM 500 MG PO TABS: 500.0000 mg | ORAL_TABLET | Freq: Two times a day (BID) | ORAL | 4 refills | Status: DC
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
## 2019-03-22 ENCOUNTER — Telehealth: Payer: Self-pay | Admitting: Neurology

## 2019-03-22 NOTE — Telephone Encounter (Signed)
[employer] Auth: [id number] (exp. 03/22/19 to 09/17/19) order sent to [facility]. They will reach out to the patient to schedule.

## 2019-04-04 ENCOUNTER — Other Ambulatory Visit: Payer: Self-pay

## 2019-04-04 ENCOUNTER — Telehealth: Payer: BLUE CROSS/BLUE SHIELD | Admitting: Neurology

## 2019-04-14 ENCOUNTER — Telehealth: Payer: BLUE CROSS/BLUE SHIELD | Admitting: Neurology

## 2019-04-14 ENCOUNTER — Other Ambulatory Visit: Payer: Self-pay

## 2019-04-18 NOTE — Telephone Encounter (Signed)
Pt called in and stated [facility] said that her insurance isnt in Network

## 2019-04-19 NOTE — Telephone Encounter (Signed)
I left a voicemail for the patient to call back about her MRI going to [facility].

## 2019-04-20 NOTE — Telephone Encounter (Signed)
Since [facility] is not in her net work I faxed the order to [place] with [facility]. Their number is [phone] & fax # [phone].

## 2019-04-21 ENCOUNTER — Inpatient Hospital Stay: Admission: RE | Admit: 2019-04-21 | Payer: BLUE CROSS/BLUE SHIELD | Source: Ambulatory Visit

## 2019-05-04 ENCOUNTER — Telehealth: Payer: Self-pay | Admitting: Neurology

## 2019-05-04 ENCOUNTER — Encounter: Payer: Self-pay | Admitting: Neurology

## 2019-05-04 NOTE — Telephone Encounter (Signed)
Please call patient, MRI of the brain from [facility] on May 03, 2019, showed no acute intracranial abnormality, mild chronic white matter disease in frontal lobe bilaterally, most consistent with microvascular ischemic changes,

## 2019-05-04 NOTE — Telephone Encounter (Signed)
Called the pt to review the results from MRI of the brain. There was no answer. LVM for the pt and informed I would send a mychart message as well.

## 2019-07-12 ENCOUNTER — Encounter (HOSPITAL_COMMUNITY): Payer: Self-pay

## 2019-07-12 ENCOUNTER — Other Ambulatory Visit: Payer: Self-pay

## 2019-07-12 ENCOUNTER — Emergency Department (HOSPITAL_COMMUNITY)
Admission: EM | Admit: 2019-07-12 | Discharge: 2019-07-12 | Disposition: A | Discharge status: Home / Self Care | Payer: BLUE CROSS/BLUE SHIELD | Attending: Emergency Medicine | Admitting: Emergency Medicine

## 2019-07-12 DIAGNOSIS — R569 Unspecified convulsions: Secondary | ICD-10-CM | POA: Diagnosis present

## 2019-07-12 DIAGNOSIS — I10 Essential (primary) hypertension: Secondary | ICD-10-CM | POA: Diagnosis not present

## 2019-07-12 HISTORY — DX: Unspecified convulsions: R56.9

## 2019-07-12 LAB — COMPREHENSIVE METABOLIC PANEL
ALT: 11 U/L (ref 0–44)
AST: 22 U/L (ref 15–41)
Albumin: 4 g/dL (ref 3.5–5.0)
Alkaline Phosphatase: 57 U/L (ref 38–126)
Anion gap: 13 (ref 5–15)
BUN: 8 mg/dL (ref 6–20)
CO2: 19 mmol/L — ABNORMAL LOW (ref 22–32)
Calcium: 8.9 mg/dL (ref 8.9–10.3)
Chloride: 102 mmol/L (ref 98–111)
Creatinine, Ser: 1.19 mg/dL — ABNORMAL HIGH (ref 0.44–1.00)
GFR calc Af Amer: >60 mL/min (ref >60)
GFR calc non Af Amer: 55 mL/min — ABNORMAL LOW (ref >60)
Glucose, Bld: 113 mg/dL — ABNORMAL HIGH (ref 70–99)
Potassium: 4.1 mmol/L (ref 3.5–5.1)
Sodium: 134 mmol/L — ABNORMAL LOW (ref 135–145)
Total Bilirubin: 0.7 mg/dL (ref 0.3–1.2)
Total Protein: 7.1 g/dL (ref 6.5–8.1)

## 2019-07-12 LAB — CBC
HCT: 41.8 % (ref 36.0–46.0)
Hemoglobin: 12.4 g/dL (ref 12.0–15.0)
MCH: 25.7 pg — ABNORMAL LOW (ref 26.0–34.0)
MCHC: 29.7 g/dL — ABNORMAL LOW (ref 30.0–36.0)
MCV: 86.5 fL (ref 80.0–100.0)
Platelets: 281 10*3/uL (ref 150–400)
RBC: 4.83 MIL/uL (ref 3.87–5.11)
RDW: 13.5 % (ref 11.5–15.5)
WBC: 9.8 10*3/uL (ref 4.0–10.5)
nRBC: 0 % (ref 0.0–0.2)

## 2019-07-12 LAB — URINALYSIS, ROUTINE W REFLEX MICROSCOPIC
Bilirubin Urine: NEGATIVE
Glucose, UA: NEGATIVE mg/dL
Hgb urine dipstick: NEGATIVE
Ketones, ur: NEGATIVE mg/dL
Leukocytes,Ua: NEGATIVE
Nitrite: NEGATIVE
Protein, ur: NEGATIVE mg/dL
Specific Gravity, Urine: 1.015 (ref 1.005–1.030)
pH: 6 (ref 5.0–8.0)

## 2019-07-12 LAB — LACTIC ACID, PLASMA
Lactic Acid, Venous: 6.7 mmol/L (ref 0.5–1.9)
Lactic Acid, Venous: 1 mmol/L (ref 0.5–1.9)

## 2019-07-12 LAB — RAPID URINE DRUG SCREEN, HOSP PERFORMED
Amphetamines: NOT DETECTED
Barbiturates: NOT DETECTED
Benzodiazepines: NOT DETECTED
Cocaine: NOT DETECTED
Opiates: NOT DETECTED
Tetrahydrocannabinol: NOT DETECTED

## 2019-07-12 MED ORDER — LEVETIRACETAM IN NACL 500 MG/100ML IV SOLN
500.0000 mg | Freq: Once | INTRAVENOUS | Status: AC
  Administered 2019-07-12: 500 mg via INTRAVENOUS
  Filled 2019-07-12: qty 100

## 2019-07-12 MED ORDER — SODIUM CHLORIDE 0.9 % IV BOLUS
1000.0000 mL | Freq: Once | INTRAVENOUS | Status: AC
  Administered 2019-07-12: 1000 mL via INTRAVENOUS

## 2019-07-12 MED ORDER — LORAZEPAM 2 MG/ML IJ SOLN
1.0000 mg | Freq: Once | INTRAMUSCULAR | Status: AC
  Administered 2019-07-12: 1 mg via INTRAVENOUS
  Filled 2019-07-12: qty 1

## 2019-07-12 MED ORDER — SODIUM CHLORIDE 0.9 % IV BOLUS
1000.0000 mL | Freq: Once | INTRAVENOUS | Status: AC
  Administered 2019-07-12: 20:00:00 1000 mL via INTRAVENOUS

## 2019-07-12 NOTE — Discharge Instructions (Addendum)
You were evaluated in the Emergency Department and after careful evaluation, we did not find any emergent condition requiring admission or further testing in the hospital.  Your exam/testing today was overall reassuring.  Please continue to take your Keppra at home and follow-up with your neurologist.  Please return to the Emergency Department if you experience any worsening of your condition.  We encourage you to follow up with a primary care provider.  Thank you for allowing [place] to be a part of your care.

## 2019-07-12 NOTE — ED Notes (Signed)
Date and time results received: 07/12/19 "8:03 PM    Test: Lactic Acid Critical Value: 6.7`  Name of Provider Notified: Sedonia Small, MD  Orders Received? Or Actions Taken?:More fluids

## 2019-07-12 NOTE — ED Provider Notes (Signed)
[facility] Emergency Department Provider Note MRN:  [id number]  Arrival date & time: 07/12/19     Chief Complaint   Seizures   History of Present Illness   Faith Oconnor is a 43 y.o. year-old female with a history of hypertension, seizure disorder presenting to the ED with chief complaint of seizures.  Per report, patient had a seizure while sitting in her car with her daughter.  Was still convulsing upon EMS arrival, postictal here in the emergency department.  Recently started on Keppra for seizure disorder.  Missed today's dose.  I was unable to obtain an accurate HPI, PMH, or ROS due to the patient's altered mental status.  Level 5 caveat.  Review of Systems  Positive for seizure.  Patient's Health History    Past Medical History:  Diagnosis Date  . Anxiety   . Hypertension   . Seizures (HCC)     Past Surgical History:  Procedure Laterality Date  . NO PAST SURGERIES      Family History  Problem Relation Age of Onset  . Hypertension Mother   . Osteoarthritis Mother   . Hyperlipidemia Mother   . Hypertension Father   . Diabetes Mellitus II Father   . Hyperlipidemia Father   . Hypertension Sister   . Sleep apnea Sister   . Depression Sister   . Breast cancer Maternal Grandmother 20  . Breast cancer Maternal Aunt 30       Metastasized to her bones  . Pancreatic cancer Maternal Aunt   . Depression Sister   . Hypertension Sister   . Epilepsy Daughter   . Migraines Daughter     Social History   Socioeconomic History  . Marital status: Married    Spouse name: Not on file  . Number of children: Not on file  . Years of education: Not on file  . Highest education level: Not on file  Occupational History  . Not on file  Social Needs  . Financial resource strain: Not on file  . Food insecurity    Worry: Not on file    Inability: Not on file  . Transportation needs    Medical: Not on file    Non-medical: Not on file  Tobacco Use   . Smoking status: Never Smoker  . Smokeless tobacco: Never Used  Substance and Sexual Activity  . Alcohol use: No  . Drug use: No  . Sexual activity: Yes    Partners: Male    Birth control/protection: I.U.D.    Comment: Husband  Lifestyle  . Physical activity    Days per week: Not on file    Minutes per session: Not on file  . Stress: Not on file  Relationships  . Social Musicianconnections    Talks on phone: Not on file    Gets together: Not on file    Attends religious service: Not on file    Active member of club or organization: Not on file    Attends meetings of clubs or organizations: Not on file    Relationship status: Not on file  . Intimate partner violence    Fear of current or ex partner: Not on file    Emotionally abused: Not on file    Physically abused: Not on file    Forced sexual activity: Not on file  Other Topics Concern  . Not on file  Social History Narrative  . Not on file     Physical Exam  Vital Signs and Nursing  Notes reviewed Vitals:   07/12/19 2215 07/12/19 2230  BP: 114/72 116/78  Pulse:    Resp: (!) 22 (!) 23  SpO2:      CONSTITUTIONAL: Well-appearing, NAD NEURO: Somnolent but wakes to voice, moving all extremities EYES:  eyes equal and reactive ENT/NECK:  no LAD, no JVD CARDIO: Regular rate, well-perfused, normal S1 and S2 PULM:  CTAB no wheezing or rhonchi GI/GU:  normal bowel sounds, non-distended, non-tender MSK/SPINE:  No gross deformities, no edema SKIN:  no rash, atraumatic PSYCH:  Appropriate speech and behavior  Diagnostic and Interventional Summary    EKG Interpretation  Date/Time:  Tuesday July 12 2019 19:23:14 EDT Ventricular Rate:  113 PR Interval:    QRS Duration: 74 QT Interval:  329 QTC Calculation: 452 R Axis:   62 Text Interpretation:  Sinus tachycardia Low voltage, precordial leads Nonspecific T abnormalities, diffuse leads Confirmed by Gerlene Fee [id number]) on 07/12/2019 10:38:18 PM      Labs Reviewed   CBC - Abnormal; Notable for the following components:      Result Value   MCH 25.7 (*)    MCHC 29.7 (*)    All other components within normal limits  COMPREHENSIVE METABOLIC PANEL - Abnormal; Notable for the following components:   Sodium 134 (*)    CO2 19 (*)    Glucose, Bld 113 (*)    Creatinine, Ser 1.19 (*)    GFR calc non Af Amer 55 (*)    All other components within normal limits  LACTIC ACID, PLASMA - Abnormal; Notable for the following components:   Lactic Acid, Venous 6.7 (*)    All other components within normal limits  URINALYSIS, ROUTINE W REFLEX MICROSCOPIC  RAPID URINE DRUG SCREEN, HOSP PERFORMED  LACTIC ACID, PLASMA    No orders to display    Medications  sodium chloride 0.9 % bolus 1,000 mL (0 mLs Intravenous Stopped 07/12/19 2235)  LORazepam (ATIVAN) injection 1 mg (1 mg Intravenous Given 07/12/19 1930)  levETIRAcetam (KEPPRA) IVPB 500 mg/100 mL premix (0 mg Intravenous Stopped 07/12/19 2014)  sodium chloride 0.9 % bolus 1,000 mL (0 mLs Intravenous Stopped 07/12/19 2235)     Procedures Critical Care  ED Course and Medical Decision Making  I have reviewed the triage vital signs and the nursing notes.  Pertinent labs & imaging results that were available during my care of the patient were reviewed by me and considered in my medical decision making (see below for details).  Seizure in this 43 year old female with recently diagnosed seizure disorder, missed today's dose of Keppra.  Will provide IV Ativan, IV Keppra, obtain laboratory evaluation to exclude secondary causes of seizure.  Currently with reassuring neurological exam, no fever, no indication for CNS imaging.  No seizure activity during patient's ED visit.  Work-up is overall reassuring, initially elevated lactate supports that his seizure occurred, it has cleared after IV fluids.  Patient confirms that she has Keppra at home and will try to avoid any missed doses and follow-up with her neurologist.   Barth Kirks. Sedonia Small, [place] [email or web address]  Final Clinical Impressions(s) / ED Diagnoses     ICD-10-CM   1. Seizure ([place])  R56.9     ED Discharge Orders    None      Discharge Instructions Discussed with and Provided to Patient:   Discharge Instructions     You were evaluated in the Emergency Department and after careful evaluation, we did not find any  emergent condition requiring admission or further testing in the hospital.  Your exam/testing today was overall reassuring.  Please continue to take your Keppra at home and follow-up with your neurologist.  Please return to the Emergency Department if you experience any worsening of your condition.  We encourage you to follow up with a primary care provider.  Thank you for allowing [place] to be a part of your care.        Sabas Sous, MD 07/12/19 2312

## 2019-07-12 NOTE — ED Notes (Signed)
Patient verbalizes understanding of discharge instructions. Opportunity for questioning and answers were provided. Armband removed by staff, pt discharged from ED. Pt. ambulatory and discharged home.  
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
## 2019-07-13 ENCOUNTER — Encounter: Payer: Self-pay | Admitting: Physician Assistant

## 2019-07-13 ENCOUNTER — Ambulatory Visit (INDEPENDENT_AMBULATORY_CARE_PROVIDER_SITE_OTHER): Payer: BLUE CROSS/BLUE SHIELD | Admitting: Physician Assistant

## 2019-07-13 VITALS — BP 144/99 | HR 92

## 2019-07-13 DIAGNOSIS — F329 Major depressive disorder, single episode, unspecified: Secondary | ICD-10-CM

## 2019-07-13 DIAGNOSIS — F419 Anxiety disorder, unspecified: Secondary | ICD-10-CM | POA: Insufficient documentation

## 2019-07-13 DIAGNOSIS — F32A Depression, unspecified: Secondary | ICD-10-CM | POA: Insufficient documentation

## 2019-07-13 MED ORDER — LORAZEPAM 0.5 MG PO TABS: 0.5000 mg | ORAL_TABLET | Freq: Two times a day (BID) | ORAL | 1 refills | Status: DC | PRN

## 2019-07-13 NOTE — Progress Notes (Signed)
Virtual Visit via Video   I connected with patient on 07/13/19 at  4:00 PM EDT by a video enabled telemedicine application and verified that I am speaking with the correct person using two identifiers.  Location patient: Home Location provider: [facility] Office Persons participating in the virtual visit: Patient, Provider, [place] (Patina Moore)  I discussed the limitations of evaluation and management by telemedicine and the availability of in person appointments. The patient expressed understanding and agreed to proceed.  Subjective:   HPI:   Patient presents via D[facility]Me today to discuss refills of medication. Patient has not been seen in > 1 year. Patient currently on Sertraline 25 mg QD (prescribed by specialist) and Lorazepam 0.5 mg PRN. Feels like overall mood is doing well but has times of acute anxiety for which she needs the Lorazepam. Has history of seizures, maintained with Keppra well overall -- had cut herself back from 2 tablets a day to 1 tablet and had a seizure a few days ago. Was evaluated at ER and with her Neurologist -- restarted back on 2 per day. Neurologist recommended she continue to keep the Lorazepam on hand  ROS:   See pertinent positives and negatives per HPI.  Patient Active Problem List   Diagnosis Date Noted  . Anxiety and depression 07/13/2019  . Seizures ([place]) 03/17/2019  . Low back strain, subsequent encounter 05/17/2018  . Essential hypertension 03/09/2018  . Breast cancer screening 03/09/2018  . Cervical cancer screening 03/09/2018  . Sacrodynia 03/09/2018    Social History   Tobacco Use  . Smoking status: Never Smoker  . Smokeless tobacco: Never Used  Substance Use Topics  . Alcohol use: No    Current Outpatient Medications:  .  acetaminophen (TYLENOL) 325 MG tablet, Take 650 mg by mouth every 6 (six) hours as needed for mild pain or headache. , Disp: , Rfl:  .  cyclobenzaprine (FLEXERIL) 10 MG tablet, Take 10 mg by mouth 3  (three) times daily as needed for muscle spasms. , Disp: , Rfl:  .  fluticasone (FLONASE) 50 MCG/ACT nasal spray, Place 2 sprays into both nostrils daily. (Patient taking differently: Place 2 sprays into both nostrils daily as needed for allergies or rhinitis. ), Disp: 16 g, Rfl: 0 .  ibuprofen (ADVIL) 200 MG tablet, Take 400 mg by mouth every 6 (six) hours as needed for headache or mild pain., Disp: , Rfl:  .  levETIRAcetam (KEPPRA) 500 MG tablet, Take 1 tablet (500 mg total) by mouth 2 (two) times daily., Disp: 180 tablet, Rfl: 4 .  levonorgestrel (MIRENA) 20 MCG/24HR IUD, 1 each by Intrauterine route once., Disp: , Rfl:  .  lisinopril-hydrochlorothiazide (PRINZIDE,ZESTORETIC) 10-12.5 MG tablet, Take 1 tablet by mouth daily., Disp: 30 tablet, Rfl: 1 .  LORazepam (ATIVAN) 0.5 MG tablet, Take 1 tablet (0.5 mg total) by mouth 2 (two) times daily. (Patient taking differently: Take 0.5 mg by mouth 2 (two) times daily as needed for anxiety. ), Disp: 60 tablet, Rfl: 1 .  meclizine (ANTIVERT) 25 MG tablet, Take 25 mg by mouth 3 (three) times daily as needed for dizziness. , Disp: , Rfl:  .  sertraline (ZOLOFT) 25 MG tablet, Take 25 mg by mouth at bedtime. , Disp: , Rfl:  .  triamcinolone (KENALOG) 0.025 % cream, Apply 1 application topically 2 (two) times daily as needed (for itching). , Disp: , Rfl:   Allergies  Allergen Reactions  . Penicillins Itching, Swelling and Other (See Comments)  Vaginal irritation/swelling/severe itching Did it involve swelling of the face/tongue/throat, SOB, or low BP? No Did it involve sudden or severe rash/hives, skin peeling, or any reaction on the inside of your mouth or nose? No Did you need to seek medical attention at a hospital or doctor's office? No When did it last happen? Adulthood If all above answers are "NO", may proceed with cephalosporin use.   . Clindamycin Swelling and Rash    Vaginal irritation/swelling    Objective:   BP (!) 144/99   Pulse 92    Patient is well-developed, well-nourished in no acute distress.  Resting comfortably at home.  Head is normocephalic, atraumatic.  No labored breathing.  Speech is clear and coherent with logical content.  Patient is alert and oriented at baseline.   Assessment and Plan:   1. Anxiety and depression Stable. Continue current regimen. Ativan refilled. Will update CSC at next in-office visit. She is scheduling a CPE.    Piedad Climes, PA-C 07/13/2019

## 2019-07-13 NOTE — Progress Notes (Signed)
I have discussed the procedure for the virtual visit with the patient who has given consent to proceed with assessment and treatment.   Marlaina Coburn S Cari Vandeberg, CMA     

## 2019-07-18 ENCOUNTER — Other Ambulatory Visit: Payer: Self-pay

## 2019-07-18 ENCOUNTER — Ambulatory Visit (INDEPENDENT_AMBULATORY_CARE_PROVIDER_SITE_OTHER): Payer: BLUE CROSS/BLUE SHIELD | Admitting: Neurology

## 2019-07-18 ENCOUNTER — Encounter: Payer: Self-pay | Admitting: Neurology

## 2019-07-18 VITALS — BP 152/92 | HR 61 | Temp 97.7°F | Ht 63.0 in | Wt 197.5 lb

## 2019-07-18 DIAGNOSIS — R569 Unspecified convulsions: Secondary | ICD-10-CM

## 2019-07-18 MED ORDER — LEVETIRACETAM 500 MG PO TABS: 500.0000 mg | ORAL_TABLET | Freq: Two times a day (BID) | ORAL | 4 refills | Status: DC
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
## 2019-07-30 ENCOUNTER — Encounter: Payer: Self-pay | Admitting: Physician Assistant

## 2019-07-30 DIAGNOSIS — E871 Hypo-osmolality and hyponatremia: Secondary | ICD-10-CM

## 2019-08-03 NOTE — Telephone Encounter (Signed)
Please help patient get scheduled for lab appt at [facility] -- right after lunch is best time for her (1-1:30) if possible. Orders in system.

## 2019-08-08 ENCOUNTER — Ambulatory Visit: Payer: BLUE CROSS/BLUE SHIELD | Admitting: Neurology

## 2019-08-08 ENCOUNTER — Other Ambulatory Visit: Payer: Self-pay

## 2019-08-08 DIAGNOSIS — R569 Unspecified convulsions: Secondary | ICD-10-CM

## 2019-08-09 ENCOUNTER — Other Ambulatory Visit: Payer: BLUE CROSS/BLUE SHIELD

## 2019-08-17 ENCOUNTER — Telehealth: Payer: Self-pay | Admitting: Neurology

## 2019-08-17 NOTE — Procedures (Signed)
   HISTORY: 43 year old female, presented for seizure  TECHNIQUE:  This is a routine 16 channel EEG recording with one channel devoted to a limited EKG recording.  It was performed during wakefulness, drowsiness and asleep.  Hyperventilation and photic stimulation were performed as activating procedures.  There are minimum muscle and movement artifact noted.  Upon maximum arousal, posterior dominant waking rhythm consistent of rhythmic alpha range activity, with frequency of 10 hz. Activities are symmetric over the bilateral posterior derivations and attenuated with eye opening.  Hyperventilation produced mild/moderate buildup with higher amplitude and the slower activities noted.  Photic stimulation did not alter the tracing.  During EEG recording, patient developed drowsiness and no deeper stage of sleep was achieved. During EEG recording, there was no epileptiform discharge noted.  EKG demonstrate sinus rhythm, with heart rate of 72 bpm.  CONCLUSION: This is a  normal awake EEG.  There is no electrodiagnostic evidence of epileptiform discharge.  Marcial Pacas, M.D. Ph.D.  [facility] [place], [place] [address] Phone: [phone] Fax:      [phone]

## 2019-08-17 NOTE — Telephone Encounter (Signed)
Pt would like a call with the results to the EEG as soon as the results are available.

## 2019-08-18 NOTE — Telephone Encounter (Signed)
I have spoken to the patient.  She verbalized understanding of results and to continue her medications, as prescribed.

## 2019-08-18 NOTE — Telephone Encounter (Signed)
EEG was normal. I have released it to my chart.

## 2019-09-19 ENCOUNTER — Encounter: Payer: Self-pay | Admitting: Physician Assistant

## 2019-09-19 ENCOUNTER — Ambulatory Visit (INDEPENDENT_AMBULATORY_CARE_PROVIDER_SITE_OTHER)
Admission: RE | Admit: 2019-09-19 | Discharge: 2019-09-19 | Disposition: A | Discharge status: Home / Self Care | Payer: BLUE CROSS/BLUE SHIELD | Source: Ambulatory Visit

## 2019-09-19 DIAGNOSIS — J069 Acute upper respiratory infection, unspecified: Secondary | ICD-10-CM | POA: Diagnosis not present

## 2019-09-19 DIAGNOSIS — R05 Cough: Secondary | ICD-10-CM

## 2019-09-19 DIAGNOSIS — R42 Dizziness and giddiness: Secondary | ICD-10-CM | POA: Diagnosis not present

## 2019-09-19 NOTE — ED Provider Notes (Signed)
Virtual Visit via Video Note:  Faith Oconnor  initiated request for Telemedicine visit with [facility] team. I connected with Spencer Serratore  on 09/20/2019 at 8:13 AM  for a synchronized telemedicine visit using a video enabled HIPPA compliant telemedicine application. I verified that I am speaking with Faith Oconnor  using two identifiers. Javyon Fontan C Laiklynn Raczynski, PA-C  was physically located in a [facility] site and Faith Oconnor was located at a different location.   The limitations of evaluation and management by telemedicine as well as the availability of in-person appointments were discussed. Patient was informed that she  may incur a bill ( including co-pay) for this virtual visit encounter. Faith Oconnor  expressed understanding and gave verbal consent to proceed with virtual visit.     History of Present Illness:Faith Oconnor  is a 43 y.o. female presents for evaluation of congestion, drainage and dizziness.  Patient states that for the past couple days she has noticed some postnasal drainage and tickle in her throat especially at nighttime.  She has had a cough that has been off and on.  She has been using Hall's, trying to drink plenty of fluids and using Tylenol sinus and congestion.  Today when she woke up she had a sensation of dizziness.  She describes it as feeling "swimmy".  States initially she thought it was related to moving too quickly, but she has continued to have an unsteady/heaviness sensation in her head with certain movements.  She denies associated headaches, vision changes.  Denies associated nausea or vomiting.  Denies any ear pain or ringing in ears.  She did start using Flonase which she feels has helped her symptoms.  She checked her blood pressure at home and was 135/87.  She denies any difficulty speaking or one-sided weakness.  Denies room spinning sensation.  Denies change in diet/fluids, but does note she believes she does not drink  enough water.  Denies chest pain or shortness of breath.     Allergies  Allergen Reactions  . Penicillins Itching, Swelling and Other (See Comments)    Vaginal irritation/swelling/severe itching Did it involve swelling of the face/tongue/throat, SOB, or low BP? No Did it involve sudden or severe rash/hives, skin peeling, or any reaction on the inside of your mouth or nose? No Did you need to seek medical attention at a hospital or doctor's office? No When did it last happen? Adulthood If all above answers are "NO", may proceed with cephalosporin use.   . Clindamycin Swelling and Rash    Vaginal irritation/swelling     Past Medical History:  Diagnosis Date  . Anxiety   . Hypertension   . Seizures ([place])      Social History   Tobacco Use  . Smoking status: Never Smoker  . Smokeless tobacco: Never Used  Substance Use Topics  . Alcohol use: No  . Drug use: No        Observations/Objective: Physical Exam  Constitutional: She is oriented to person, place, and time and well-developed, well-nourished, and in no distress. No distress.  HENT:  Head: Normocephalic and atraumatic.  Eyes: Pupils are equal, round, and reactive to light. EOM are normal.  Pulmonary/Chest: Effort normal. No respiratory distress.  Speaking in full sentences  Musculoskeletal:     Cervical back: Normal range of motion.     Comments: Moving extremities appropriately  Neurological: She is alert and oriented to person, place, and time.  Speech clear, face symmetric, answering questions appropriately,  no gross motor deficits noted during interaction     Assessment and Plan:    ICD-10-CM   1. Viral URI with cough  J06.9   2. Dizziness  R42     Recommended patient to have Covid testing, may schedule appointment to drive-through or come in for nurse visit for PCR swab.  Recommending continue taking medicines for congestion, Flonase, pushing fluids and hydrating.  Follow-up in person if symptoms not  improving with resolution of URI symptoms.  Do not suspect stroke at this time, discussed red flags and warning signs,Discussed strict return precautions. Patient verbalized understanding and is agreeable with plan.    Follow Up Instructions:     I discussed the assessment and treatment plan with the patient. The patient was provided an opportunity to ask questions and all were answered. The patient agreed with the plan and demonstrated an understanding of the instructions.   The patient was advised to call back or seek an in-person evaluation if the symptoms worsen or if the condition fails to improve as anticipated.      Lew Dawes, PA-C  09/20/2019 8:13 AM         Lew Dawes, PA-C 09/20/19 [phone]

## 2019-09-19 NOTE — Discharge Instructions (Addendum)
Please get COVID tested in person- nurse visit at urgent care or drive through appointment  Continue flonase, Tylenol cold and flu Push Fluids   Follow up if symptoms not improving or worsening

## 2019-10-09 ENCOUNTER — Encounter: Payer: Self-pay | Admitting: Physician Assistant

## 2019-10-10 ENCOUNTER — Encounter: Payer: Self-pay | Admitting: Physician Assistant

## 2019-10-10 ENCOUNTER — Other Ambulatory Visit: Payer: Self-pay

## 2019-10-10 ENCOUNTER — Ambulatory Visit (INDEPENDENT_AMBULATORY_CARE_PROVIDER_SITE_OTHER): Payer: BC Managed Care – PPO | Admitting: Physician Assistant

## 2019-10-10 VITALS — BP 120/90

## 2019-10-10 DIAGNOSIS — M5441 Lumbago with sciatica, right side: Secondary | ICD-10-CM

## 2019-10-10 MED ORDER — MELOXICAM 15 MG PO TABS: 15.0000 mg | ORAL_TABLET | Freq: Every day | ORAL | 0 refills | Status: DC

## 2019-10-10 MED ORDER — CYCLOBENZAPRINE HCL 10 MG PO TABS: 10.0000 mg | ORAL_TABLET | Freq: Three times a day (TID) | ORAL | 0 refills | Status: DC | PRN

## 2019-10-10 NOTE — Progress Notes (Signed)
Virtual Visit via Video   I connected with patient on 10/10/19 at  4:00 PM EST by a video enabled telemedicine application and verified that I am speaking with the correct person using two identifiers.  Location patient: Home Location provider: [facility] Persons participating in the virtual visit: Patient, Provider, [place] (Patina Moore)  I discussed the limitations of evaluation and management by telemedicine and the availability of in person appointments. The patient expressed understanding and agreed to proceed.  Subjective:   HPI:   Patient presents via [facility] today c/o 1 week of R low back pain described as aching in nature, sometimes sharp. Pain radiates into the buttock and down the leg. Notes pain with standing, sitting and position change. Denies numbness or tingling of leg or groin. Denies change to bowel or bladder. Has been applying heating pad,doing hot showers and Icy Hot to the area.   ROS:   See pertinent positives and negatives per HPI.  Patient Active Problem List   Diagnosis Date Noted  . Anxiety and depression 07/13/2019  . Seizures ([facility]) 03/17/2019  . Low back strain, subsequent encounter 05/17/2018  . Essential hypertension 03/09/2018  . Breast cancer screening 03/09/2018  . Cervical cancer screening 03/09/2018  . Sacrodynia 03/09/2018    Social History   Tobacco Use  . Smoking status: Never Smoker  . Smokeless tobacco: Never Used  Substance Use Topics  . Alcohol use: No    Current Outpatient Medications:  .  acetaminophen (TYLENOL) 325 MG tablet, Take 650 mg by mouth every 6 (six) hours as needed for mild pain or headache. , Disp: , Rfl:  .  cyclobenzaprine (FLEXERIL) 10 MG tablet, Take 10 mg by mouth 3 (three) times daily as needed for muscle spasms. , Disp: , Rfl:  .  fluticasone (FLONASE) 50 MCG/ACT nasal spray, Place 2 sprays into both nostrils daily. (Patient taking differently: Place 2 sprays into both nostrils daily as  needed for allergies or rhinitis. ), Disp: 16 g, Rfl: 0 .  ibuprofen (ADVIL) 200 MG tablet, Take 400 mg by mouth every 6 (six) hours as needed for headache or mild pain., Disp: , Rfl:  .  levETIRAcetam (KEPPRA) 500 MG tablet, Take 1 tablet (500 mg total) by mouth 2 (two) times daily., Disp: 180 tablet, Rfl: 4 .  levonorgestrel (MIRENA) 20 MCG/24HR IUD, 1 each by Intrauterine route once., Disp: , Rfl:  .  lisinopril-hydrochlorothiazide (PRINZIDE,ZESTORETIC) 10-12.5 MG tablet, Take 1 tablet by mouth daily., Disp: 30 tablet, Rfl: 1 .  LORazepam (ATIVAN) 0.5 MG tablet, Take 1 tablet (0.5 mg total) by mouth 2 (two) times daily as needed for anxiety., Disp: 30 tablet, Rfl: 1 .  meclizine (ANTIVERT) 25 MG tablet, Take 25 mg by mouth 3 (three) times daily as needed for dizziness. , Disp: , Rfl:  .  sertraline (ZOLOFT) 25 MG tablet, Take 25 mg by mouth at bedtime. , Disp: , Rfl:  .  triamcinolone (KENALOG) 0.025 % cream, Apply 1 application topically 2 (two) times daily as needed (for itching). , Disp: , Rfl:   Allergies  Allergen Reactions  . Penicillins Itching, Swelling and Other (See Comments)    Vaginal irritation/swelling/severe itching Did it involve swelling of the face/tongue/throat, SOB, or low BP? No Did it involve sudden or severe rash/hives, skin peeling, or any reaction on the inside of your mouth or nose? No Did you need to seek medical attention at a hospital or doctor's office? No When did it last happen? Adulthood  If all above answers are "NO", may proceed with cephalosporin use.   . Clindamycin Swelling and Rash    Vaginal irritation/swelling    Objective:   BP 120/90   Patient is well-developed, well-nourished in no acute distress.  Resting comfortably at home.  Head is normocephalic, atraumatic.  No labored breathing.  Speech is clear and coherent with logical content.  Patient is alert and oriented at baseline.   Assessment and Plan:   1. Acute right-sided low back  pain with right-sided sciatica Atraumatic and without alarm signs or symptoms. Rest. Avoid heavy lifting or overexertion. Start Meloxicam once daily with food. Tylenol for breakthrough pain. Flexeril per orders. Continue heating pad. Stretches reviewed. Handout sent to MyChart. Follow-up if symptoms are not resolving.  - cyclobenzaprine (FLEXERIL) 10 MG tablet; Take 1 tablet (10 mg total) by mouth 3 (three) times daily as needed for muscle spasms.  Dispense: 30 tablet; Refill: 0 - meloxicam (MOBIC) 15 MG tablet; Take 1 tablet (15 mg total) by mouth daily.  Dispense: 15 tablet; Refill: 0    Piedad Climes, [place] 10/10/2019

## 2019-10-10 NOTE — Patient Instructions (Signed)
Instructions sent to MyChart.   Please avoid heavy lifting and overexertion.  You can continue to use the heating pad but do so in 15 minute intervals. Start the Meloxicam once daily. Tylenol for breakthrough pain Take the Cyclobenzaprine in the evening for muscle spasms. This can be taken up to 3 x daily but no driving or operating machinery if taking in the day time.   As symptoms improve significantly and moving forward once resolved, the stretches below can be helpful to prevent recurrence.    Sciatica Rehab Ask your health care provider which exercises are safe for you. Do exercises exactly as told by your health care provider and adjust them as directed. It is normal to feel mild stretching, pulling, tightness, or discomfort as you do these exercises. Stop right away if you feel sudden pain or your pain gets worse. Do not begin these exercises until told by your health care provider. Stretching and range-of-motion exercises These exercises warm up your muscles and joints and improve the movement and flexibility of your hips and back. These exercises also help to relieve pain, numbness, and tingling. Sciatic nerve glide 1. Sit in a chair with your head facing down toward your chest. Place your hands behind your back. Let your shoulders slump forward. 2. Slowly straighten one of your legs while you tilt your head back as if you are looking toward the ceiling. Only straighten your leg as far as you can without making your symptoms worse. 3. Hold this position for __________ seconds. 4. Slowly return to the starting position. 5. Repeat with your other leg. Repeat __________ times. Complete this exercise __________ times a day. Knee to chest with hip adduction and internal rotation  1. Lie on your back on a firm surface with both legs straight. 2. Bend one of your knees and move it up toward your chest until you feel a gentle stretch in your lower back and buttock. Then, move your knee  toward the shoulder that is on the opposite side from your leg. This is hip adduction and internal rotation. ? Hold your leg in this position by holding on to the front of your knee. 3. Hold this position for __________ seconds. 4. Slowly return to the starting position. 5. Repeat with your other leg. Repeat __________ times. Complete this exercise __________ times a day. Prone extension on elbows  1. Lie on your abdomen on a firm surface. A bed may be too soft for this exercise. 2. Prop yourself up on your elbows. 3. Use your arms to help lift your chest up until you feel a gentle stretch in your abdomen and your lower back. ? This will place some of your body weight on your elbows. If this is uncomfortable, try stacking pillows under your chest. ? Your hips should stay down, against the surface that you are lying on. Keep your hip and back muscles relaxed. 4. Hold this position for __________ seconds. 5. Slowly relax your upper body and return to the starting position. Repeat __________ times. Complete this exercise __________ times a day. Strengthening exercises These exercises build strength and endurance in your back. Endurance is the ability to use your muscles for a long time, even after they get tired. Pelvic tilt This exercise strengthens the muscles that lie deep in the abdomen. 1. Lie on your back on a firm surface. Bend your knees and keep your feet flat on the floor. 2. Tense your abdominal muscles. Tip your pelvis up toward the ceiling  and flatten your lower back into the floor. ? To help with this exercise, you may place a small towel under your lower back and try to push your back into the towel. 3. Hold this position for __________ seconds. 4. Let your muscles relax completely before you repeat this exercise. Repeat __________ times. Complete this exercise __________ times a day. Alternating arm and leg raises  1. Get on your hands and knees on a firm surface. If you are  on a hard floor, you may want to use padding, such as an exercise mat, to cushion your knees. 2. Line up your arms and legs. Your hands should be directly below your shoulders, and your knees should be directly below your hips. 3. Lift your left leg behind you. At the same time, raise your right arm and straighten it in front of you. ? Do not lift your leg higher than your hip. ? Do not lift your arm higher than your shoulder. ? Keep your abdominal and back muscles tight. ? Keep your hips facing the ground. ? Do not arch your back. ? Keep your balance carefully, and do not hold your breath. 4. Hold this position for __________ seconds. 5. Slowly return to the starting position. 6. Repeat with your right leg and your left arm. Repeat __________ times. Complete this exercise __________ times a day. Posture and body mechanics Good posture and healthy body mechanics can help to relieve stress in your body's tissues and joints. Body mechanics refers to the movements and positions of your body while you do your daily activities. Posture is part of body mechanics. Good posture means:  Your spine is in its natural S-curve position (neutral).  Your shoulders are pulled back slightly.  Your head is not tipped forward. Follow these guidelines to improve your posture and body mechanics in your everyday activities. Standing   When standing, keep your spine neutral and your feet about hip width apart. Keep a slight bend in your knees. Your ears, shoulders, and hips should line up.  When you do a task in which you stand in one place for a long time, place one foot up on a stable object that is 2-4 inches (5-10 cm) high, such as a footstool. This helps keep your spine neutral. Sitting   When sitting, keep your spine neutral and keep your feet flat on the floor. Use a footrest, if necessary, and keep your thighs parallel to the floor. Avoid rounding your shoulders, and avoid tilting your head  forward.  When working at a desk or a computer, keep your desk at a height where your hands are slightly lower than your elbows. Slide your chair under your desk so you are close enough to maintain good posture.  When working at a computer, place your monitor at a height where you are looking straight ahead and you do not have to tilt your head forward or downward to look at the screen. Resting  When lying down and resting, avoid positions that are most painful for you.  If you have pain with activities such as sitting, bending, stooping, or squatting, lie in a position in which your body does not bend very much. For example, avoid curling up on your side with your arms and knees near your chest (fetal position).  If you have pain with activities such as standing for a long time or reaching with your arms, lie with your spine in a neutral position and bend your knees slightly. Try the  following positions: ? Lying on your side with a pillow between your knees. ? Lying on your back with a pillow under your knees. Lifting   When lifting objects, keep your feet at least shoulder width apart and tighten your abdominal muscles.  Bend your knees and hips and keep your spine neutral. It is important to lift using the strength of your legs, not your back. Do not lock your knees straight out.  Always ask for help to lift heavy or awkward objects. This information is not intended to replace advice given to you by your health care provider. Make sure you discuss any questions you have with your health care provider. Document Revised: 01/07/2019 Document Reviewed: 10/07/2018 Elsevier Patient Education  2020 [employer].

## 2019-10-10 NOTE — Progress Notes (Signed)
I have discussed the procedure for the virtual visit with the patient who has given consent to proceed with assessment and treatment.   Torri Michalski S Nima Kemppainen, CMA     

## 2019-10-10 NOTE — Telephone Encounter (Signed)
Please call patient to schedule her for appointment. Can be in office if she prefers and passes screening. Otherwise video visit is fine. Thank you.

## 2019-11-23 ENCOUNTER — Encounter: Payer: Self-pay | Admitting: Physician Assistant

## 2019-11-24 NOTE — Telephone Encounter (Signed)
Patient would need to schedule appointment for CPE before I would place lab orders for these labs.

## 2019-12-09 ENCOUNTER — Encounter: Payer: Self-pay | Admitting: Physician Assistant

## 2019-12-09 ENCOUNTER — Ambulatory Visit (INDEPENDENT_AMBULATORY_CARE_PROVIDER_SITE_OTHER): Payer: PRIVATE HEALTH INSURANCE | Admitting: Physician Assistant

## 2019-12-09 ENCOUNTER — Other Ambulatory Visit: Payer: Self-pay

## 2019-12-09 VITALS — BP 150/98 | HR 75 | Temp 98.0°F | Resp 16 | Ht 63.0 in | Wt 205.0 lb

## 2019-12-09 DIAGNOSIS — R569 Unspecified convulsions: Secondary | ICD-10-CM | POA: Diagnosis not present

## 2019-12-09 DIAGNOSIS — Z1231 Encounter for screening mammogram for malignant neoplasm of breast: Secondary | ICD-10-CM | POA: Diagnosis not present

## 2019-12-09 DIAGNOSIS — I10 Essential (primary) hypertension: Secondary | ICD-10-CM | POA: Diagnosis not present

## 2019-12-09 DIAGNOSIS — H6983 Other specified disorders of Eustachian tube, bilateral: Secondary | ICD-10-CM | POA: Diagnosis not present

## 2019-12-09 DIAGNOSIS — F32A Depression, unspecified: Secondary | ICD-10-CM

## 2019-12-09 DIAGNOSIS — F329 Major depressive disorder, single episode, unspecified: Secondary | ICD-10-CM

## 2019-12-09 DIAGNOSIS — Z0000 Encounter for general adult medical examination without abnormal findings: Secondary | ICD-10-CM | POA: Insufficient documentation

## 2019-12-09 DIAGNOSIS — F419 Anxiety disorder, unspecified: Secondary | ICD-10-CM

## 2019-12-09 DIAGNOSIS — H6993 Unspecified Eustachian tube disorder, bilateral: Secondary | ICD-10-CM

## 2019-12-09 LAB — COMPREHENSIVE METABOLIC PANEL
ALT: 8 U/L (ref 0–35)
AST: 11 U/L (ref 0–37)
Albumin: 4.1 g/dL (ref 3.5–5.2)
Alkaline Phosphatase: 69 U/L (ref 39–117)
BUN: 13 mg/dL (ref 6–23)
CO2: 29 mEq/L (ref 19–32)
Calcium: 9.4 mg/dL (ref 8.4–10.5)
Chloride: 102 mEq/L (ref 96–112)
Creatinine, Ser: 0.92 mg/dL (ref 0.40–1.20)
GFR: 79.68 mL/min (ref >60.00)
Glucose, Bld: 84 mg/dL (ref 70–99)
Potassium: 4.5 mEq/L (ref 3.5–5.1)
Sodium: 137 mEq/L (ref 135–145)
Total Bilirubin: 0.5 mg/dL (ref 0.2–1.2)
Total Protein: 7.2 g/dL (ref 6.0–8.3)

## 2019-12-09 LAB — CBC WITH DIFFERENTIAL/PLATELET
Basophils Absolute: 0 10*3/uL (ref 0.0–0.1)
Basophils Relative: 0.6 % (ref 0.0–3.0)
Eosinophils Absolute: 0.1 10*3/uL (ref 0.0–0.7)
Eosinophils Relative: 1.8 % (ref 0.0–5.0)
HCT: 39.8 % (ref 36.0–46.0)
Hemoglobin: 13.1 g/dL (ref 12.0–15.0)
Lymphocytes Relative: 28.7 % (ref 12.0–46.0)
Lymphs Abs: 2.2 10*3/uL (ref 0.7–4.0)
MCHC: 32.8 g/dL (ref 30.0–36.0)
MCV: 80.7 fl (ref 78.0–100.0)
Monocytes Absolute: 0.8 10*3/uL (ref 0.1–1.0)
Monocytes Relative: 9.7 % (ref 3.0–12.0)
Neutro Abs: 4.6 10*3/uL (ref 1.4–7.7)
Neutrophils Relative %: 59.2 % (ref 43.0–77.0)
Platelets: 300 10*3/uL (ref 150.0–400.0)
RBC: 4.93 Mil/uL (ref 3.87–5.11)
RDW: 13.9 % (ref 11.5–15.5)
WBC: 7.8 10*3/uL (ref 4.0–10.5)

## 2019-12-09 LAB — LIPID PANEL
Cholesterol: 154 mg/dL (ref 0–200)
HDL: 50.8 mg/dL (ref >39.00)
LDL Cholesterol: 91 mg/dL (ref 0–99)
NonHDL: 102.85
Total CHOL/HDL Ratio: 3
Triglycerides: 60 mg/dL (ref 0.0–149.0)
VLDL: 12 mg/dL (ref 0.0–40.0)

## 2019-12-09 LAB — HEMOGLOBIN A1C: Hgb A1c MFr Bld: 5.7 % (ref 4.6–6.5)

## 2019-12-09 NOTE — Progress Notes (Signed)
Patient presents to clinic today for annual exam.  Patient is fasting for labs.  Chronic Issues: Hypertension --patient is currently on a regimen of lisinopril hydrochlorothiazide 10-12.5 mg once daily.  Endorses taking medication daily as directed and tolerating well.  Has been out of her medication for about a week.  Has noted some elevated blood pressure since running out of medicine.  Denies chest pain or shortness of breath.  Has noted a little bit of dizziness but is also has some nasal congestion and ear pressure/popping.  Denies fever or chills.  Denies malaise or fatigue.  Denies any head injury or headache.  Seizure --followed by neurology.  Patient is currently on a regimen of Keppra 500 mg.  Taking as directed by specialist.  Tolerating well.  Denies any breakthrough symptoms.  Anxiety/depression --patient endorses doing very well overall on her regimen of sertraline 25 mg once daily.  Does have prescription for lorazepam which she takes as needed for more situational anxiety.  Denies frequent use.  Denies suicidal thought or ideation.  Health Maintenance: Immunizations --up-to-date. Mammogram --due.  Gets through her GYN.  Will schedule. PAP -- Followed by [facility]. Notes had within past 2 years. Will obtain records.   Past Medical History:  Diagnosis Date  . Anxiety   . Hypertension   . Seizures ([facility])     Past Surgical History:  Procedure Laterality Date  . NO PAST SURGERIES      Current Outpatient Medications on File Prior to Visit  Medication Sig Dispense Refill  . cyclobenzaprine (FLEXERIL) 10 MG tablet Take 1 tablet (10 mg total) by mouth 3 (three) times daily as needed for muscle spasms. 30 tablet 0  . levETIRAcetam (KEPPRA) 500 MG tablet Take 1 tablet (500 mg total) by mouth 2 (two) times daily. 180 tablet 4  . levonorgestrel (MIRENA) 20 MCG/24HR IUD 1 each by Intrauterine route once.    Marland Kitchen lisinopril-hydrochlorothiazide (PRINZIDE,ZESTORETIC)  10-12.5 MG tablet Take 1 tablet by mouth daily. 30 tablet 1  . LORazepam (ATIVAN) 0.5 MG tablet Take 1 tablet (0.5 mg total) by mouth 2 (two) times daily as needed for anxiety. 30 tablet 1  . meclizine (ANTIVERT) 25 MG tablet Take 25 mg by mouth 3 (three) times daily as needed for dizziness.     . sertraline (ZOLOFT) 25 MG tablet Take 25 mg by mouth at bedtime.     . triamcinolone (KENALOG) 0.025 % cream Apply 1 application topically 2 (two) times daily as needed (for itching).     . fluticasone (FLONASE) 50 MCG/ACT nasal spray Place 2 sprays into both nostrils daily. (Patient not taking: Reported on 12/09/2019) 16 g 0   No current facility-administered medications on file prior to visit.    Allergies  Allergen Reactions  . Penicillins Itching, Swelling and Other (See Comments)    Vaginal irritation/swelling/severe itching Did it involve swelling of the face/tongue/throat, SOB, or low BP? No Did it involve sudden or severe rash/hives, skin peeling, or any reaction on the inside of your mouth or nose? No Did you need to seek medical attention at a hospital or doctor's office? No When did it last happen? Adulthood If all above answers are "NO", may proceed with cephalosporin use.   . Clindamycin Swelling and Rash    Vaginal irritation/swelling    Family History  Problem Relation Age of Onset  . Hypertension Mother   . Osteoarthritis Mother   . Hyperlipidemia Mother   . Hypertension Father   .  Diabetes Mellitus II Father   . Hyperlipidemia Father   . Hypertension Sister   . Sleep apnea Sister   . Depression Sister   . Breast cancer Maternal Grandmother 20  . Breast cancer Maternal Aunt 30       Metastasized to her bones  . Pancreatic cancer Maternal Aunt   . Depression Sister   . Hypertension Sister   . Epilepsy Daughter   . Migraines Daughter     Social History   Socioeconomic History  . Marital status: Married    Spouse name: Not on file  . Number of children: Not on  file  . Years of education: Not on file  . Highest education level: Not on file  Occupational History  . Not on file  Tobacco Use  . Smoking status: Never Smoker  . Smokeless tobacco: Never Used  Substance and Sexual Activity  . Alcohol use: No  . Drug use: No  . Sexual activity: Yes    Partners: Male    Birth control/protection: I.U.D.    Comment: Husband  Other Topics Concern  . Not on file  Social History Narrative  . Not on file   Social Determinants of Health   Financial Resource Strain:   . Difficulty of Paying Living Expenses:   Food Insecurity:   . Worried About Charity fundraiser in the Last Year:   . Arboriculturist in the Last Year:   Transportation Needs:   . Film/video editor (Medical):   Marland Kitchen Lack of Transportation (Non-Medical):   Physical Activity:   . Days of Exercise per Week:   . Minutes of Exercise per Session:   Stress:   . Feeling of Stress :   Social Connections:   . Frequency of Communication with Friends and Family:   . Frequency of Social Gatherings with Friends and Family:   . Attends Religious Services:   . Active Member of Clubs or Organizations:   . Attends Archivist Meetings:   Marland Kitchen Marital Status:   Intimate Partner Violence:   . Fear of Current or Ex-Partner:   . Emotionally Abused:   Marland Kitchen Physically Abused:   . Sexually Abused:    Review of Systems  Constitutional: Negative for fever and weight loss.  HENT: Negative for ear discharge, ear pain, hearing loss and tinnitus.   Eyes: Negative for blurred vision, double vision, photophobia and pain.  Respiratory: Negative for cough and shortness of breath.   Cardiovascular: Negative for chest pain and palpitations.  Gastrointestinal: Negative for abdominal pain, blood in stool, constipation, diarrhea, heartburn, melena, nausea and vomiting.  Genitourinary: Negative for dysuria, flank pain, frequency, hematuria and urgency.  Musculoskeletal: Negative for falls.  Neurological:  Positive for dizziness. Negative for loss of consciousness and headaches.  Endo/Heme/Allergies: Negative for environmental allergies.  Psychiatric/Behavioral: Negative for depression, hallucinations, substance abuse and suicidal ideas. The patient is not nervous/anxious and does not have insomnia.     BP (!) 150/98 Comment: has been out of BP meds for 1 week  Pulse 75   Temp 98 F (36.7 C) (Temporal)   Resp 16   Ht 5\' 3"  (1.6 m)   Wt 205 lb (93 kg)   SpO2 98%   BMI 36.31 kg/m   Physical Exam Vitals reviewed.  HENT:     Head: Normocephalic and atraumatic.     Right Ear: Ear canal and external ear normal. A middle ear effusion (Serous) is present.     Left  Ear: Ear canal and external ear normal. A middle ear effusion (Serous) is present.     Nose: Nose normal. No mucosal edema.     Mouth/Throat:     Pharynx: Uvula midline. No oropharyngeal exudate or posterior oropharyngeal erythema.  Eyes:     Conjunctiva/sclera: Conjunctivae normal.     Pupils: Pupils are equal, round, and reactive to light.  Neck:     Thyroid: No thyromegaly.  Cardiovascular:     Rate and Rhythm: Normal rate and regular rhythm.     Heart sounds: Normal heart sounds.  Pulmonary:     Effort: Pulmonary effort is normal. No respiratory distress.     Breath sounds: Normal breath sounds. No wheezing or rales.  Abdominal:     General: Bowel sounds are normal. There is no distension.     Palpations: Abdomen is soft. There is no mass.     Tenderness: There is no abdominal tenderness. There is no guarding or rebound.  Musculoskeletal:     Cervical back: Neck supple.  Lymphadenopathy:     Cervical: No cervical adenopathy.  Skin:    General: Skin is warm and dry.     Findings: No rash.  Neurological:     Mental Status: She is alert and oriented to person, place, and time.     Cranial Nerves: No cranial nerve deficit.    Assessment/Plan: 1. Visit for preventive health examination Depression screen  negative. Health Maintenance reviewed. Preventive schedule discussed and handout given in AVS. Will obtain fasting labs today. - CBC with Differential/Platelet - Comprehensive metabolic panel - Hemoglobin A1c - Lipid panel  2. Encounter for screening mammogram for malignant neoplasm of breast Dated.  Average risk.  Asymptomatic.  [facility] have [facility].  Referral placed.  3. Essential hypertension BP above goal today with patient out of her medication.  Has previously been well controlled on current regimen.  Medications refilled.  She is to continue DASH diet.  Keep an eye on blood pressure at home/work to make sure going back to normal.  Will obtain fasting labs today. - Comprehensive metabolic panel - Lipid panel  4. Seizures ([facility]) Stable.  Continue management per specialist.  5. Anxiety and depression Stable.  Continue current regimen.  6. Eustachian tube dysfunction, bilateral Start Flonase OTC.  Consider OTC Claritin or Zyrtec.  Follow-up if not improving.  This visit occurred during the SARS-CoV-2 public health emergency.  Safety protocols were in place, including screening questions prior to the visit, additional usage of staff PPE, and extensive cleaning of exam room while observing appropriate contact time as indicated for disinfecting solutions.     Piedad Climes, PA-C

## 2019-12-09 NOTE — Patient Instructions (Addendum)
Please go to the lab for blood work.   Our office will call you with your results unless you have chosen to receive results via MyChart.  If your blood work is normal we will follow-up each year for physicals and as scheduled for chronic medical problems.  If anything is abnormal we will treat accordingly and get you in for a follow-up.  Please restart the Flonase once daily over the next 4-5 days. Start an OTC Claritin or Zyrtec.  Restart your BP medications -- refills have been sent to pharmacy  You will be contacted by [facility] for your follow-up and Mammogram.   Preventive Care 50-74 Years Old, Female Preventive care refers to visits with your health care provider and lifestyle choices that can promote health and wellness. This includes:  A yearly physical exam. This may also be called an annual well check.  Regular dental visits and eye exams.  Immunizations.  Screening for certain conditions.  Healthy lifestyle choices, such as eating a healthy diet, getting regular exercise, not using drugs or products that contain nicotine and tobacco, and limiting alcohol use. What can I expect for my preventive care visit? Physical exam Your health care provider will check your:  Height and weight. This may be used to calculate body mass index (BMI), which tells if you are at a healthy weight.  Heart rate and blood pressure.  Skin for abnormal spots. Counseling Your health care provider may ask you questions about your:  Alcohol, tobacco, and drug use.  Emotional well-being.  Home and relationship well-being.  Sexual activity.  Eating habits.  Work and work Statistician.  Method of birth control.  Menstrual cycle.  Pregnancy history. What immunizations do I need?  Influenza (flu) vaccine  This is recommended every year. Tetanus, diphtheria, and pertussis (Tdap) vaccine  You may need a Td booster every 10 years. Varicella (chickenpox) vaccine  You  may need this if you have not been vaccinated. Zoster (shingles) vaccine  You may need this after age 23. Measles, mumps, and rubella (MMR) vaccine  You may need at least one dose of MMR if you were born in 1957 or later. You may also need a second dose. Pneumococcal conjugate (PCV13) vaccine  You may need this if you have certain conditions and were not previously vaccinated. Pneumococcal polysaccharide (PPSV23) vaccine  You may need one or two doses if you smoke cigarettes or if you have certain conditions. Meningococcal conjugate (MenACWY) vaccine  You may need this if you have certain conditions. Hepatitis A vaccine  You may need this if you have certain conditions or if you travel or work in places where you may be exposed to hepatitis A. Hepatitis B vaccine  You may need this if you have certain conditions or if you travel or work in places where you may be exposed to hepatitis B. Haemophilus influenzae type b (Hib) vaccine  You may need this if you have certain conditions. Human papillomavirus (HPV) vaccine  If recommended by your health care provider, you may need three doses over 6 months. You may receive vaccines as individual doses or as more than one vaccine together in one shot (combination vaccines). Talk with your health care provider about the risks and benefits of combination vaccines. What tests do I need? Blood tests  Lipid and cholesterol levels. These may be checked every 5 years, or more frequently if you are over 1 years old.  Hepatitis C test.  Hepatitis B test. Screening  Lung cancer screening. You may have this screening every year starting at age 7 if you have a 30-pack-year history of smoking and currently smoke or have quit within the past 15 years.  Colorectal cancer screening. All adults should have this screening starting at age 60 and continuing until age 63. Your health care provider may recommend screening at age 22 if you are at increased  risk. You will have tests every 1-10 years, depending on your results and the type of screening test.  Diabetes screening. This is done by checking your blood sugar (glucose) after you have not eaten for a while (fasting). You may have this done every 1-3 years.  Mammogram. This may be done every 1-2 years. Talk with your health care provider about when you should start having regular mammograms. This may depend on whether you have a family history of breast cancer.  BRCA-related cancer screening. This may be done if you have a family history of breast, ovarian, tubal, or peritoneal cancers.  Pelvic exam and Pap test. This may be done every 3 years starting at age 49. Starting at age 46, this may be done every 5 years if you have a Pap test in combination with an HPV test. Other tests  Sexually transmitted disease (STD) testing.  Bone density scan. This is done to screen for osteoporosis. You may have this scan if you are at high risk for osteoporosis. Follow these instructions at home: Eating and drinking  Eat a diet that includes fresh fruits and vegetables, whole grains, lean protein, and low-fat dairy.  Take vitamin and mineral supplements as recommended by your health care provider.  Do not drink alcohol if: ? Your health care provider tells you not to drink. ? You are pregnant, may be pregnant, or are planning to become pregnant.  If you drink alcohol: ? Limit how much you have to 0-1 drink a day. ? Be aware of how much alcohol is in your drink. In the [place] one drink equals one 12 oz bottle of beer (355 mL), one 5 oz glass of wine (148 mL), or one 1 oz glass of hard liquor (44 mL). Lifestyle  Take daily care of your teeth and gums.  Stay active. Exercise for at least 30 minutes on 5 or more days each week.  Do not use any products that contain nicotine or tobacco, such as cigarettes, e-cigarettes, and chewing tobacco. If you need help quitting, ask your health care  provider.  If you are sexually active, practice safe sex. Use a condom or other form of birth control (contraception) in order to prevent pregnancy and STIs (sexually transmitted infections).  If told by your health care provider, take low-dose aspirin daily starting at age 46. What's next?  Visit your health care provider once a year for a well check visit.  Ask your health care provider how often you should have your eyes and teeth checked.  Stay up to date on all vaccines. This information is not intended to replace advice given to you by your health care provider. Make sure you discuss any questions you have with your health care provider. Document Revised: 05/27/2018 Document Reviewed: 05/27/2018 Elsevier Patient Education  2020 [employer].

## 2019-12-12 ENCOUNTER — Other Ambulatory Visit: Payer: Self-pay | Admitting: Emergency Medicine

## 2019-12-12 ENCOUNTER — Encounter: Payer: Self-pay | Admitting: Physician Assistant

## 2019-12-12 DIAGNOSIS — F329 Major depressive disorder, single episode, unspecified: Secondary | ICD-10-CM

## 2019-12-12 DIAGNOSIS — F32A Depression, unspecified: Secondary | ICD-10-CM

## 2019-12-12 DIAGNOSIS — Z124 Encounter for screening for malignant neoplasm of cervix: Secondary | ICD-10-CM

## 2019-12-13 MED ORDER — LISINOPRIL-HYDROCHLOROTHIAZIDE 10-12.5 MG PO TABS: 1.0000 | ORAL_TABLET | Freq: Every day | ORAL | 1 refills | Status: DC

## 2019-12-13 MED ORDER — LORAZEPAM 0.5 MG PO TABS: 0.5000 mg | ORAL_TABLET | Freq: Two times a day (BID) | ORAL | 1 refills | Status: DC | PRN

## 2019-12-15 ENCOUNTER — Encounter: Payer: Self-pay | Admitting: Physician Assistant

## 2019-12-15 MED ORDER — VALACYCLOVIR HCL 1 G PO TABS: 2000.0000 mg | ORAL_TABLET | Freq: Two times a day (BID) | ORAL | 0 refills | Status: DC

## 2019-12-25 ENCOUNTER — Encounter: Payer: Self-pay | Admitting: Physician Assistant

## 2019-12-26 ENCOUNTER — Encounter: Payer: Self-pay | Admitting: Physician Assistant

## 2019-12-26 NOTE — Telephone Encounter (Signed)
She would need video visit for new Rx.

## 2020-01-16 ENCOUNTER — Encounter: Payer: Self-pay | Admitting: Neurology

## 2020-01-16 ENCOUNTER — Ambulatory Visit: Payer: PRIVATE HEALTH INSURANCE | Admitting: Neurology

## 2020-01-16 ENCOUNTER — Other Ambulatory Visit: Payer: Self-pay

## 2020-01-16 VITALS — BP 131/91 | HR 64 | Temp 97.2°F | Ht 63.0 in | Wt 206.0 lb

## 2020-01-16 DIAGNOSIS — R569 Unspecified convulsions: Secondary | ICD-10-CM | POA: Diagnosis not present

## 2020-01-16 MED ORDER — LEVETIRACETAM 500 MG PO TABS: 500.0000 mg | ORAL_TABLET | Freq: Two times a day (BID) | ORAL | 4 refills | Status: DC
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
## 2020-01-16 NOTE — Patient Instructions (Signed)
It was nice to meet you today! Continue on the Keppra at current dose, taking every day, not to miss a dose  Try to have frequent snacks, avoiding letting your blood sugar drop too low Call for recurrent seizure See you back in 6 months

## 2020-01-23 ENCOUNTER — Other Ambulatory Visit: Payer: Self-pay

## 2020-01-23 ENCOUNTER — Other Ambulatory Visit (HOSPITAL_COMMUNITY)
Admission: RE | Admit: 2020-01-23 | Discharge: 2020-01-23 | Disposition: A | Discharge status: Home / Self Care | Payer: PRIVATE HEALTH INSURANCE | Source: Ambulatory Visit | Attending: Obstetrics and Gynecology | Admitting: Obstetrics and Gynecology

## 2020-01-23 ENCOUNTER — Ambulatory Visit (INDEPENDENT_AMBULATORY_CARE_PROVIDER_SITE_OTHER): Payer: PRIVATE HEALTH INSURANCE | Admitting: Obstetrics and Gynecology

## 2020-01-23 ENCOUNTER — Encounter: Payer: Self-pay | Admitting: Obstetrics and Gynecology

## 2020-01-23 VITALS — BP 136/90 | HR 72 | Ht 63.0 in | Wt 205.9 lb

## 2020-01-23 DIAGNOSIS — Z113 Encounter for screening for infections with a predominantly sexual mode of transmission: Secondary | ICD-10-CM

## 2020-01-23 DIAGNOSIS — Z01419 Encounter for gynecological examination (general) (routine) without abnormal findings: Secondary | ICD-10-CM | POA: Insufficient documentation

## 2020-01-23 DIAGNOSIS — Z1272 Encounter for screening for malignant neoplasm of vagina: Secondary | ICD-10-CM | POA: Diagnosis not present

## 2020-01-23 DIAGNOSIS — L910 Hypertrophic scar: Secondary | ICD-10-CM | POA: Diagnosis not present

## 2020-01-23 DIAGNOSIS — Z1231 Encounter for screening mammogram for malignant neoplasm of breast: Secondary | ICD-10-CM

## 2020-01-23 NOTE — Progress Notes (Signed)
WELL-WOMAN PHYSICAL  Patient name: Duane Trias MRN [id number]  Date of birth: [birth year] Chief Complaint:   Gynecologic Exam  History of Present Illness:   Faith Oconnor is a 44 y.o. [id number] African American female being seen today for a routine well-woman exam.  Current complaints: need to establish GYN care. Was a patient at [facility], but they stopped taking her insurance.  PCP: Marcelline Mates, PA-C      does desire STI labs No LMP recorded. (Menstrual status: IUD). The current method of family planning is IUD. Placed in 2018 at [facility] Last pap 11/2016. Results were: normal Last mammogram: 12/17/2016. Results were: normal. Family h/o breast cancer: Yes - maternal grandmother and aunts Last colonoscopy: n/a. Results were: n/a. Family h/o colorectal cancer: Yes - maternal aunt Review of Systems:   Pertinent items are noted in HPI Denies any headaches, blurred vision, fatigue, shortness of breath, chest pain, abdominal pain, abnormal vaginal discharge/itching/odor/irritation, problems with periods, bowel movements, urination, or intercourse unless otherwise stated above. Pertinent History Reviewed:  Reviewed past medical,surgical, social and family history.  Reviewed problem list, medications and allergies. Physical Assessment:   Vitals:   01/23/20 0826  BP: 136/90  Pulse: 72  Weight: 205 lb 14.4 oz (93.4 kg)  Height: 5\' 3"  (1.6 m)  Body mass index is 36.47 kg/m.        Physical Examination:   General appearance - well appearing, and in no distress  Mental status - alert, oriented to person, place, and time  Psych:  She has a normal mood and affect  Skin - warm and dry, normal color, no suspicious lesions noted  Chest - effort normal, all lung fields clear to auscultation bilaterally  Heart - normal rate and regular rhythm  Neck:  midline trachea, no thyromegaly or nodules  Breasts - breasts appear normal, no suspicious masses, no skin or  nipple changes or  axillary nodes  2 cm thickened keloid  Abdomen - soft, nontender, nondistended, no masses or organomegaly  Pelvic - VULVA: normal appearing vulva with no masses, tenderness or lesions  VAGINA: normal appearing vagina with normal color and discharge, no lesions  CERVIX: normal appearing cervix without discharge or lesions, no CMT - IUD strings visualized  Thin prep pap is done with HR HPV cotesting  UTERUS: uterus is felt to be normal size, shape, consistency and nontender   ADNEXA: No adnexal masses or tenderness noted.  Rectal - deferred  Extremities:  No swelling or varicosities noted  No results found for this or any previous visit (from the past 24 hour(s)).  Assessment & Plan:  1) Well-Woman Exam with Pap  2) Well woman exam with routine gynecological exam  - Cytology - PAP( [place]),  - MM Digital Screening  3) Screening for STD (sexually transmitted disease)  - HIV Antibody (routine testing w rflx),  - RPR,  - Hepatitis C Antibody,  - Hepatitis B Surface AntiGEN  4) Keloid scar  -  Ambulatory referral to Dermatology   Labs/procedures today: STI testing  Mammogram ASAP  Colonoscopy at age 1 or sooner if problems  Orders Placed This Encounter  Procedures  . MM Digital Screening  . HIV Antibody (routine testing w rflx)  . RPR  . Hepatitis C Antibody  . Hepatitis B Surface AntiGEN  . Ambulatory referral to Dermatology    Meds: No orders of the defined types were placed in this encounter.   Follow-up: Return in about 1 year (around  01/22/2021) for Annual Exam.  Laury Deep MSN, CNM 01/23/2020

## 2020-01-23 NOTE — Addendum Note (Signed)
Addended by: Henrietta Dine on: 01/23/2020 09:27 AM   Modules accepted: Orders

## 2020-01-24 LAB — CERVICOVAGINAL ANCILLARY ONLY
Bacterial Vaginitis (gardnerella): NEGATIVE
Candida Glabrata: NEGATIVE
Candida Vaginitis: NEGATIVE
Comment: NEGATIVE
Comment: NEGATIVE
Comment: NEGATIVE

## 2020-01-24 LAB — RPR: RPR Ser Ql: NONREACTIVE

## 2020-01-24 LAB — CYTOLOGY - PAP
Chlamydia: NEGATIVE
Comment: NEGATIVE
Comment: NEGATIVE
Comment: NEGATIVE
Comment: NORMAL
Diagnosis: NEGATIVE
High risk HPV: NEGATIVE
Neisseria Gonorrhea: NEGATIVE
Trichomonas: NEGATIVE

## 2020-01-24 LAB — HIV ANTIBODY (ROUTINE TESTING W REFLEX): HIV Screen 4th Generation wRfx: NONREACTIVE

## 2020-01-24 LAB — HEPATITIS C ANTIBODY: Hep C Virus Ab: 0.1 s/co ratio (ref 0.0–0.9)

## 2020-01-24 LAB — HEPATITIS B SURFACE ANTIGEN: Hepatitis B Surface Ag: NEGATIVE

## 2020-02-05 ENCOUNTER — Telehealth: Payer: PRIVATE HEALTH INSURANCE | Admitting: Family

## 2020-02-05 DIAGNOSIS — J019 Acute sinusitis, unspecified: Secondary | ICD-10-CM

## 2020-02-05 MED ORDER — FLUCONAZOLE 150 MG PO TABS: 150.0000 mg | ORAL_TABLET | ORAL | 0 refills | Status: DC | PRN

## 2020-02-05 MED ORDER — DOXYCYCLINE HYCLATE 100 MG PO TABS: 100.0000 mg | ORAL_TABLET | Freq: Two times a day (BID) | ORAL | 0 refills | Status: DC

## 2020-02-05 NOTE — Addendum Note (Signed)
Addended by: Jannifer Rodney A on: 02/05/2020 07:24 PM   Modules accepted: Orders

## 2020-02-05 NOTE — Progress Notes (Signed)

## 2020-02-07 NOTE — Progress Notes (Signed)
I have reviewed and agreed above plan. 

## 2020-02-20 ENCOUNTER — Telehealth: Payer: Self-pay | Admitting: Neurology

## 2020-02-20 NOTE — Telephone Encounter (Signed)
I called pt. She started feeling dizzy last night. Today at work she started feeling dizzy and flushed and her heart was racing. Sometimes she does feel hot and flushed immediately prior to a seizure. She did not lose consciousness at work. She ate some crackers and felt a little better. She reports that she was told to let us know if she experiences symptoms such as these that perhaps precede a seizure. I offered pt an appt tomorrow with Dr. Terrace Arabia at 10:00am, check in at 9:30am. Pt verbalized understanding of new appt date and time.

## 2020-02-20 NOTE — Telephone Encounter (Signed)
Pt called to speak with RN about dizzy spells she experienced while at work,

## 2020-02-21 ENCOUNTER — Encounter: Payer: Self-pay | Admitting: Neurology

## 2020-02-21 ENCOUNTER — Ambulatory Visit: Payer: PRIVATE HEALTH INSURANCE | Admitting: Neurology

## 2020-02-21 ENCOUNTER — Other Ambulatory Visit: Payer: Self-pay

## 2020-02-21 VITALS — BP 135/91 | HR 68 | Ht 63.0 in | Wt 205.5 lb

## 2020-02-21 DIAGNOSIS — R569 Unspecified convulsions: Secondary | ICD-10-CM | POA: Diagnosis not present

## 2020-02-21 DIAGNOSIS — F419 Anxiety disorder, unspecified: Secondary | ICD-10-CM | POA: Diagnosis not present

## 2020-02-21 MED ORDER — LAMOTRIGINE 25 MG PO TABS: 25.0000 mg | ORAL_TABLET | Freq: Every day | ORAL | 0 refills | Status: DC

## 2020-02-21 MED ORDER — LAMOTRIGINE 100 MG PO TABS: 100.0000 mg | ORAL_TABLET | Freq: Two times a day (BID) | ORAL | 11 refills | Status: DC
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
## 2020-02-21 NOTE — Patient Instructions (Signed)
Week Lamotrigine Keppra   1st week 25mg twice a day 500mg twice a day  2nd week 25mgx2 tab twice aday 500mg twice a day  3rd week 25mgx3tab twice aday 500mg once every night  4th week 100mg twice a day    

## 2020-02-23 ENCOUNTER — Other Ambulatory Visit: Payer: Self-pay | Admitting: Obstetrics and Gynecology

## 2020-02-23 ENCOUNTER — Ambulatory Visit
Admission: RE | Admit: 2020-02-23 | Discharge: 2020-02-23 | Disposition: A | Discharge status: Home / Self Care | Payer: PRIVATE HEALTH INSURANCE | Source: Ambulatory Visit | Attending: Obstetrics and Gynecology | Admitting: Obstetrics and Gynecology

## 2020-02-23 ENCOUNTER — Other Ambulatory Visit: Payer: Self-pay

## 2020-02-23 DIAGNOSIS — Z1231 Encounter for screening mammogram for malignant neoplasm of breast: Secondary | ICD-10-CM

## 2020-02-23 DIAGNOSIS — Z01419 Encounter for gynecological examination (general) (routine) without abnormal findings: Secondary | ICD-10-CM

## 2020-02-28 ENCOUNTER — Other Ambulatory Visit: Payer: Self-pay | Admitting: Obstetrics and Gynecology

## 2020-02-28 DIAGNOSIS — R928 Other abnormal and inconclusive findings on diagnostic imaging of breast: Secondary | ICD-10-CM

## 2020-03-09 ENCOUNTER — Ambulatory Visit
Admission: RE | Admit: 2020-03-09 | Discharge: 2020-03-09 | Disposition: A | Discharge status: Home / Self Care | Payer: PRIVATE HEALTH INSURANCE | Source: Ambulatory Visit | Attending: Obstetrics and Gynecology | Admitting: Obstetrics and Gynecology

## 2020-03-09 ENCOUNTER — Other Ambulatory Visit: Payer: Self-pay

## 2020-03-09 DIAGNOSIS — R928 Other abnormal and inconclusive findings on diagnostic imaging of breast: Secondary | ICD-10-CM

## 2020-03-22 ENCOUNTER — Other Ambulatory Visit: Payer: Self-pay | Admitting: Physician Assistant

## 2020-03-22 DIAGNOSIS — F32A Depression, unspecified: Secondary | ICD-10-CM

## 2020-03-22 MED ORDER — LORAZEPAM 0.5 MG PO TABS: 0.5000 mg | ORAL_TABLET | Freq: Two times a day (BID) | ORAL | 1 refills | Status: DC | PRN

## 2020-05-07 ENCOUNTER — Telehealth: Payer: Self-pay | Admitting: *Deleted

## 2020-05-07 NOTE — Telephone Encounter (Signed)
Pt FMLA form on on nurse desk.

## 2020-05-07 NOTE — Telephone Encounter (Signed)
Left message unable to reach pt. 

## 2020-05-10 NOTE — Telephone Encounter (Signed)
FMLA paper work completed and sent back to medical records for processing.  Copies made and filed.

## 2020-05-18 ENCOUNTER — Telehealth: Payer: PRIVATE HEALTH INSURANCE | Admitting: Physician Assistant

## 2020-05-18 ENCOUNTER — Encounter: Payer: Self-pay | Admitting: Physician Assistant

## 2020-05-18 DIAGNOSIS — M5442 Lumbago with sciatica, left side: Secondary | ICD-10-CM

## 2020-05-18 MED ORDER — NAPROXEN 500 MG PO TABS
500.0000 mg | ORAL_TABLET | Freq: Two times a day (BID) | ORAL | 0 refills | Status: DC
Start: 2020-05-18 — End: 2020-10-23

## 2020-05-18 MED ORDER — CYCLOBENZAPRINE HCL 10 MG PO TABS
10.0000 mg | ORAL_TABLET | Freq: Three times a day (TID) | ORAL | 0 refills | Status: DC | PRN
Start: 2020-05-18 — End: 2020-10-23

## 2020-05-18 NOTE — Progress Notes (Signed)

## 2020-05-21 ENCOUNTER — Telehealth: Payer: PRIVATE HEALTH INSURANCE | Admitting: Family

## 2020-05-21 DIAGNOSIS — M5442 Lumbago with sciatica, left side: Secondary | ICD-10-CM

## 2020-05-21 NOTE — Progress Notes (Signed)
Based on what you shared with me, I feel your condition warrants further evaluation and I recommend that you be seen for a face to face office visit.   NOTE: If you entered your credit card information for this eVisit, you will not be charged. You may see a "hold" on your card for the $35 but that hold will drop off and you will not have a charge processed.   If you are having a true medical emergency please call 911.      For an urgent face to face visit, [facility] has five urgent care centers for your convenience:      NEW:  [facility] Get Driving Directions [phone] [address] , [place] [address] . 10 am - 6pm Monday - Friday    [facility] Get Driving Directions [phone] [address] [place], [place] [address] . 10 am to 8 pm Monday-Friday . 12 pm to 8 pm Saturday-Sunday     [facility] Get Driving Directions [phone] [address] [place], [place] [address] . 8 am to 8 pm Monday-Friday . 9 am to 6 pm Saturday . 11 am to 6 pm Sunday     [facility] [place] Get Driving Directions  [phone] [address] [place], [place] [address] . 8 am to 8 pm Monday-Friday . 8 am to 4 pm Saturday-Sunday    [facility] Get Driving Directions [phone] [address] [place],  [address] . 12 pm to 6 pm Monday-Friday      Your e-visit answers were reviewed by a board certified advanced clinical practitioner to complete your personal care plan.  Thank you for using e-Visits.     

## 2020-07-12 ENCOUNTER — Other Ambulatory Visit: Payer: Self-pay | Admitting: Emergency Medicine

## 2020-07-12 DIAGNOSIS — F419 Anxiety disorder, unspecified: Secondary | ICD-10-CM

## 2020-07-12 DIAGNOSIS — F32A Depression, unspecified: Secondary | ICD-10-CM

## 2020-07-12 MED ORDER — SERTRALINE HCL 25 MG PO TABS: 25.0000 mg | ORAL_TABLET | Freq: Every day | ORAL | 1 refills | Status: DC

## 2020-07-17 ENCOUNTER — Ambulatory Visit: Payer: PRIVATE HEALTH INSURANCE | Admitting: Neurology

## 2020-08-27 ENCOUNTER — Ambulatory Visit: Payer: PRIVATE HEALTH INSURANCE | Admitting: Neurology

## 2020-09-09 ENCOUNTER — Telehealth: Payer: PRIVATE HEALTH INSURANCE | Admitting: Physician Assistant

## 2020-09-09 ENCOUNTER — Encounter: Payer: Self-pay | Admitting: Physician Assistant

## 2020-09-09 DIAGNOSIS — M79605 Pain in left leg: Secondary | ICD-10-CM

## 2020-09-09 DIAGNOSIS — M545 Low back pain, unspecified: Secondary | ICD-10-CM

## 2020-09-09 MED ORDER — NAPROXEN 500 MG PO TABS
500.0000 mg | ORAL_TABLET | Freq: Two times a day (BID) | ORAL | 0 refills | Status: DC
Start: 2020-09-09 — End: 2020-09-09

## 2020-09-09 MED ORDER — CYCLOBENZAPRINE HCL 10 MG PO TABS
10.0000 mg | ORAL_TABLET | Freq: Every day | ORAL | 0 refills | Status: DC
Start: 2020-09-09 — End: 2020-09-09

## 2020-09-09 NOTE — Progress Notes (Signed)
We are sorry that you are not feeling well.  Here is how we plan to help!  Based on what you have shared with me it looks like you mostly have acute back pain.  Acute back pain is defined as musculoskeletal pain that can resolve in 1-3 weeks with conservative treatment.  I have prescribed Naprosyn 500 mg take one by mouth twice a day non-steroid anti-inflammatory (NSAID) as well as Flexeril 10 mg every eight hours as needed which is a muscle relaxer  Some patients experience stomach irritation or in increased heartburn with anti-inflammatory drugs.  Please keep in mind that muscle relaxer's can cause fatigue and should not be taken while at work or driving.  Back pain is very common.  The pain often gets better over time.  The cause of back pain is usually not dangerous.  Most people can learn to manage their back pain on their own.  Ms. Lotus Santillo may start the prescribed regimen of medicine. If you dont have relief of the pain or if your symptoms worsen, or if you develop any new symptoms, please have a face to face visit for further evaluation of your symptoms and to include possible imaging- given that you heard a "pop" this may be related to ligament/tendon and images may be warranted.  Home Care  Stay active.  Start with short walks on flat ground if you can.  Try to walk farther each day.  Do not sit, drive or stand in one place for more than 30 minutes.  Do not stay in bed.  Do not avoid exercise or work.  Activity can help your back heal faster.  Be careful when you bend or lift an object.  Bend at your knees, keep the object close to you, and do not twist.  Sleep on a firm mattress.  Lie on your side, and bend your knees.  If you lie on your back, put a pillow under your knees.  Only take medicines as told by your doctor.  Put ice on the injured area.  Put ice in a plastic bag  Place a towel between your skin and the bag  Leave the ice on for 15-20 minutes, 3-4 times a day  for the first 2-3 days. 210 After that, you can switch between ice and heat packs.  Ask your doctor about back exercises or massage.  Avoid feeling anxious or stressed.  Find good ways to deal with stress, such as exercise.  Get Help Right Way If:  Your pain does not go away with rest or medicine.  Your pain does not go away in 1 week.  You have new problems.  You do not feel well.  The pain spreads into your legs.  You cannot control when you poop (bowel movement) or pee (urinate)  You feel sick to your stomach (nauseous) or throw up (vomit)  You have belly (abdominal) pain.  You feel like you may pass out (faint).  If you develop a fever.  Make Sure you:  Understand these instructions.  Will watch your condition  Will get help right away if you are not doing well or get worse.  Your e-visit answers were reviewed by a board certified advanced clinical practitioner to complete your personal care plan.  Depending on the condition, your plan could have included both over the counter or prescription medications.  If there is a problem please reply  once you have received a response from your provider.  Your safety is  important to [place].  If you have drug allergies check your prescription carefully.    You can use MyChart to ask questions about today's visit, request a non-urgent call back, or ask for a work or school excuse for 24 hours related to this e-Visit. If it has been greater than 24 hours you will need to follow up with your provider, or enter a new e-Visit to address those concerns.  You will get an e-mail in the next two days asking about your experience.  I hope that your e-visit has been valuable and will speed your recovery. Thank you for using e-visits.  I spent 5-10 minutes on review and completion of this note- [facility]

## 2020-09-09 NOTE — Addendum Note (Signed)
Addended by: Demetrio Lapping on: 09/09/2020 01:54 PM   Modules accepted: Orders

## 2020-09-09 NOTE — Addendum Note (Signed)
Addended by: Demetrio Lapping on: 09/09/2020 01:01 PM   Modules accepted: Orders

## 2020-09-17 ENCOUNTER — Encounter: Payer: Self-pay | Admitting: Physician Assistant

## 2020-09-17 ENCOUNTER — Other Ambulatory Visit: Payer: Self-pay | Admitting: Neurology

## 2020-09-17 DIAGNOSIS — F32A Depression, unspecified: Secondary | ICD-10-CM

## 2020-09-17 DIAGNOSIS — R569 Unspecified convulsions: Secondary | ICD-10-CM

## 2020-09-17 MED ORDER — LORAZEPAM 0.5 MG PO TABS: 0.5000 mg | ORAL_TABLET | Freq: Two times a day (BID) | ORAL | 1 refills | Status: AC | PRN

## 2020-09-17 NOTE — Progress Notes (Signed)
Orders Placed This Encounter  Procedures  . Thyroid Panel With TSH  . CBC With Differential  . Comprehensive metabolic panel  . Lamotrigine level

## 2020-09-19 ENCOUNTER — Telehealth: Payer: Self-pay | Admitting: Neurology

## 2020-09-19 NOTE — Telephone Encounter (Signed)
Pt called wanting to know if her lab orders can be released to the [facility] on [address] Pt states that by the time she gets off of work it is to late to get them done here in the office. Please advise.

## 2020-09-19 NOTE — Addendum Note (Signed)
Addended by: Tamera Stands D on: 09/19/2020 04:53 PM   Modules accepted: Orders

## 2020-09-19 NOTE — Telephone Encounter (Signed)
Our in-office lab tech, Mick Sell, released the orders so that the other [facility] location can view them. The patient will have them drawn this week.

## 2020-10-15 ENCOUNTER — Telehealth: Payer: 59 | Admitting: Family

## 2020-10-15 DIAGNOSIS — Z20822 Contact with and (suspected) exposure to covid-19: Secondary | ICD-10-CM | POA: Diagnosis not present

## 2020-10-15 MED ORDER — ALBUTEROL SULFATE HFA 108 (90 BASE) MCG/ACT IN AERS: 2.0000 | INHALATION_SPRAY | Freq: Four times a day (QID) | RESPIRATORY_TRACT | 0 refills | Status: DC | PRN

## 2020-10-15 MED ORDER — BENZONATATE 100 MG PO CAPS: 100.0000 mg | ORAL_CAPSULE | Freq: Three times a day (TID) | ORAL | 0 refills | Status: DC | PRN

## 2020-10-15 MED ORDER — FLUTICASONE PROPIONATE 50 MCG/ACT NA SUSP: 2.0000 | Freq: Every day | NASAL | 6 refills | Status: AC

## 2020-10-15 NOTE — Progress Notes (Signed)
E-Visit for Corona Virus Screening  Your current symptoms could be consistent with the coronavirus.  Many health care providers can now test patients at their office but not all are.  [facility] has multiple testing sites. For information on our COVID testing locations and hours go to [email or web address]  We are enrolling you in our MyChart Home Monitoring for COVID19 . Daily you will receive a questionnaire within the MyChart website. Our COVID 19 response team will be monitoring your responses daily.  Testing Information: The COVID-19 Community Testing sites are testing BY APPOINTMENT ONLY.  You can schedule online at [email or web address]  If you do not have access to a smart phone or computer you may call [phone] for an appointment.   Additional testing sites in the Community:  . For [facility] Testing sites in [place]  https://www.cvs.com/minuteclinic/covid-19-testing  . For Pop-up testing sites in [place]  https://covid19.ncdhhs.gov/about-covid-19/testing/find-my-testing-place/pop-testing-sites  . For [facility] https://www.[place].gov/[place]ounty/human-services/health-department/coronavirus-covid-19-info/covid-19-testing  . For [place] testing in [place] and [place] https://www.[place].gov/[place]-c[place]/human-services/health-department/coronavirus-covid-19-info/covid-19-testing  . For [facility] testing in [place]   https://[facility].care/covidtesting  For  more information about community testing call [phone]   Please quarantine yourself while awaiting your test results. Please stay home for a minimum of 10 days from the first day of illness with improving symptoms and you have had 24 hours of no fever (without the use of Tylenol (Acetaminophen) Motrin (Ibuprofen) or any fever reducing medication).  Also - Do not get tested prior to returning to work because once you have had a positive test the test can stay  positive for more than a month in some cases.   You should wear a mask or cloth face covering over your nose and mouth if you must be around other people or animals, including pets (even at home). Try to stay at least 6 feet away from other people. This will protect the people around you.  Please continue good preventive care measures, including:  frequent hand-washing, avoid touching your face, cover coughs/sneezes, stay out of crowds and keep a 6 foot distance from others.  COVID-19 is a respiratory illness with symptoms that are similar to the flu. Symptoms are typically mild to moderate, but there have been cases of severe illness and death due to the virus.   The following symptoms may appear 2-14 days after exposure: . Fever . Cough . Shortness of breath or difficulty breathing . Chills . Repeated shaking with chills . Muscle pain . Headache . Sore throat . New loss of taste or smell . Fatigue . Congestion or runny nose . Nausea or vomiting . Diarrhea  Go to the nearest hospital ED for assessment if fever/cough/breathlessness are severe or illness seems like a threat to life.  It is vitally important that if you feel that you have an infection such as this virus or any other virus that you stay home and away from places where you may spread it to others.  You should avoid contact with people age 65 and older.   You can use medication such as A prescription cough medication called Tessalon Perles 100 mg. You may take 1-2 capsules every 8 hours as needed for cough, A prescription inhaler called Albuterol MDI 90 mcg /actuation 2 puffs every 4 hours as needed for shortness of breath, wheezing, cough and A prescription for Fluticasone nasal spray 2 sprays in each nostril one time per day.  You may also take acetaminophen (Tylenol) as needed for fever.  Reduce your risk of   any infection by using the same precautions used for avoiding the common cold or flu:  Marland Kitchen Wash your hands often with soap  and warm water for at least 20 seconds.  If soap and water are not readily available, use an alcohol-based hand sanitizer with at least 60% alcohol.  . If coughing or sneezing, cover your mouth and nose by coughing or sneezing into the elbow areas of your shirt or coat, into a tissue or into your sleeve (not your hands). . Avoid shaking hands with others and consider head nods or verbal greetings only. . Avoid touching your eyes, nose, or mouth with unwashed hands.  . Avoid close contact with people who are sick. . Avoid places or events with large numbers of people in one location, like concerts or sporting events. . Carefully consider travel plans you have or are making. . If you are planning any travel outside or inside the [place], visit the CDC's Travelers' Health webpage for the latest health notices. . If you have some symptoms but not all symptoms, continue to monitor at home and seek medical attention if your symptoms worsen. . If you are having a medical emergency, call 911.  HOME CARE . Only take medications as instructed by your medical team. . Drink plenty of fluids and get plenty of rest. . A steam or ultrasonic humidifier can help if you have congestion.   GET HELP RIGHT AWAY IF YOU HAVE EMERGENCY WARNING SIGNS** FOR COVID-19. If you or someone is showing any of these signs seek emergency medical care immediately. Call 911 or proceed to your closest emergency facility if: . You develop worsening high fever. . Trouble breathing . Bluish lips or face . Persistent pain or pressure in the chest . New confusion . Inability to wake or stay awake . You cough up blood. . Your symptoms become more severe  **This list is not all possible symptoms. Contact your medical provider for any symptoms that are sever or concerning to you.  MAKE SURE YOU   Understand these instructions.  Will watch your condition.  Will get help right away if you are not doing well or get worse.  Your  e-visit answers were reviewed by a board certified advanced clinical practitioner to complete your personal care plan.  Depending on the condition, your plan could have included both over the counter or prescription medications.  If there is a problem please reply once you have received a response from your provider.  Your safety is important to [place].  If you have drug allergies check your prescription carefully.    You can use MyChart to ask questions about today's visit, request a non-urgent call back, or ask for a work or school excuse for 24 hours related to this e-Visit. If it has been greater than 24 hours you will need to follow up with your provider, or enter a new e-Visit to address those concerns. You will get an e-mail in the next two days asking about your experience.  I hope that your e-visit has been valuable and will speed your recovery. Thank you for using e-visits.

## 2020-10-15 NOTE — Progress Notes (Signed)
Approximately 5 minutes was spent documenting and reviewing patient's chart.   

## 2020-10-16 MED ORDER — BENZONATATE 100 MG PO CAPS: 100.0000 mg | ORAL_CAPSULE | Freq: Three times a day (TID) | ORAL | 0 refills | Status: DC | PRN

## 2020-10-16 NOTE — Addendum Note (Signed)
Addended by: Bennie Pierini on: 10/16/2020 01:57 PM   Modules accepted: Orders

## 2020-10-23 ENCOUNTER — Telehealth (INDEPENDENT_AMBULATORY_CARE_PROVIDER_SITE_OTHER): Payer: 59 | Admitting: Physician Assistant

## 2020-10-23 ENCOUNTER — Other Ambulatory Visit: Payer: Self-pay

## 2020-10-23 ENCOUNTER — Encounter: Payer: Self-pay | Admitting: Physician Assistant

## 2020-10-23 DIAGNOSIS — H66001 Acute suppurative otitis media without spontaneous rupture of ear drum, right ear: Secondary | ICD-10-CM

## 2020-10-23 MED ORDER — CEFDINIR 300 MG PO CAPS
300.0000 mg | ORAL_CAPSULE | Freq: Two times a day (BID) | ORAL | 0 refills | Status: DC
Start: 2020-10-23 — End: 2021-04-25

## 2020-10-23 MED ORDER — FLUCONAZOLE 150 MG PO TABS
150.0000 mg | ORAL_TABLET | Freq: Once | ORAL | 0 refills | Status: AC
Start: 2020-10-23 — End: 2020-10-23

## 2020-10-23 NOTE — Progress Notes (Signed)
I have discussed the procedure for the virtual visit with the patient who has given consent to proceed with assessment and treatment.   Cattleya Dobratz S Kyllian Clingerman, CMA     

## 2020-10-23 NOTE — Progress Notes (Signed)
Virtual Visit via Video   I connected with patient on 10/23/20 at  2:30 PM EST by a video enabled telemedicine application and verified that I am speaking with the correct person using two identifiers.  Location patient: Home Location provider: [facility] Persons participating in the virtual visit: Patient, Provider, CMA (Patina Moore)  I discussed the limitations of evaluation and management by telemedicine and the availability of in person appointments. The patient expressed understanding and agreed to proceed.  Subjective:   HPI:   Patient presents via [facility] today c/o ongoing right ear pain after COVID infection. Was diagnosed 2 weeks ago wafter having classic COVID symptoms. Is mostly back to normal but has continued with a R earache, intermittent but now constant. Associated with headache and sinus pressure. No sinus pain. Denies fever, chills, aches. Still with little appetite.   ROS:   See pertinent positives and negatives per HPI.  Patient Active Problem List   Diagnosis Date Noted  . Anxiety 02/21/2020  . Visit for preventive health examination 12/09/2019  . Anxiety and depression 07/13/2019  . Seizures (HCC) 03/17/2019  . Low back strain, subsequent encounter 05/17/2018  . Essential hypertension 03/09/2018  . Breast cancer screening 03/09/2018  . Cervical cancer screening 03/09/2018  . [place]rod[place]ia 03/09/2018    Social History   Tobacco Use  . Smoking status: Never Smoker  . Smokeless tobacco: Never Used  Substance Use Topics  . Alcohol use: No    Current Outpatient Medications:  .  albuterol (VENTOLIN HFA) 108 (90 Base) MCG/ACT inhaler, Inhale 2 puffs into the lungs every 6 (six) hours as needed for wheezing or shortness of breath., Disp: 8 g, Rfl: 0 .  benzonatate (TESSALON PERLES) 100 MG capsule, Take 1 capsule (100 mg total) by mouth 3 (three) times daily as needed., Disp: 20 capsule, Rfl: 0 .  cyclobenzaprine (FLEXERIL) 10 MG tablet,  Take 1 tablet (10 mg total) by mouth 3 (three) times daily as needed for muscle spasms., Disp: 30 tablet, Rfl: 0 .  fluticasone (FLONASE) 50 MCG/ACT nasal spray, Place 2 sprays into both nostrils daily., Disp: 16 g, Rfl: 6 .  lamoTRIgine (LAMICTAL) 100 MG tablet, Take 1 tablet (100 mg total) by mouth 2 (two) times daily., Disp: 60 tablet, Rfl: 11 .  levETIRAcetam (KEPPRA) 500 MG tablet, Take 1 tablet (500 mg total) by mouth 2 (two) times daily., Disp: 180 tablet, Rfl: 4 .  levonorgestrel (MIRENA) 20 MCG/24HR IUD, 1 each by Intrauterine route once., Disp: , Rfl:  .  lisinopril-hydrochlorothiazide (ZESTORETIC) 10-12.5 MG tablet, Take 1 tablet by mouth daily., Disp: 90 tablet, Rfl: 1 .  LORazepam (ATIVAN) 0.5 MG tablet, Take 1 tablet (0.5 mg total) by mouth 2 (two) times daily as needed for anxiety., Disp: 30 tablet, Rfl: 1 .  meclizine (ANTIVERT) 25 MG tablet, Take 25 mg by mouth 3 (three) times daily as needed for dizziness. , Disp: , Rfl:  .  sertraline (ZOLOFT) 25 MG tablet, Take 1 tablet (25 mg total) by mouth at bedtime., Disp: 90 tablet, Rfl: 1 .  triamcinolone (KENALOG) 0.025 % cream, Apply 1 application topically 2 (two) times daily as needed (for itching). , Disp: , Rfl:  .  valACYclovir (VALTREX) 1000 MG tablet, Take 2 tablets (2,000 mg total) by mouth 2 (two) times daily. For 1 day, Disp: 4 tablet, Rfl: 0  Allergies  Allergen Reactions  . Penicillins Itching, Swelling and Other (See Comments)    Vaginal irritation/swelling/severe itching Did it involve swelling  of the face/tongue/throat, SOB, or low BP? No Did it involve sudden or severe rash/hives, skin peeling, or any reaction on the inside of your mouth or nose? No Did you need to seek medical attention at a hospital or doctor's office? No When did it last happen? Adulthood If all above answers are "NO", may proceed with cephalosporin use.   . Clindamycin Swelling and Rash    Vaginal irritation/swelling    Objective:   There  were no vitals taken for this visit.  Patient is well-developed, well-nourished in no acute distress.  Resting comfortably at home.  Head is normocephalic, atraumatic.  No labored breathing.  Speech is clear and coherent with logical content.  Patient is alert and oriented at baseline.   Assessment and Plan:   1. Non-recurrent acute suppurative otitis media of right ear without spontaneous rupture of tympanic membrane Rx Cefdinir due to intolerance to penicillins, azithromycin. Supportive measures and OTC medications reviewed. Follow-up if symptoms are not resolving.     Piedad Climes, [place] 10/23/2020

## 2020-10-24 ENCOUNTER — Ambulatory Visit (INDEPENDENT_AMBULATORY_CARE_PROVIDER_SITE_OTHER): Payer: 59 | Admitting: Neurology

## 2020-10-24 ENCOUNTER — Other Ambulatory Visit: Payer: Self-pay

## 2020-10-24 ENCOUNTER — Encounter: Payer: Self-pay | Admitting: Neurology

## 2020-10-24 VITALS — BP 158/98 | HR 79 | Ht 63.0 in | Wt 200.0 lb

## 2020-10-24 DIAGNOSIS — R569 Unspecified convulsions: Secondary | ICD-10-CM

## 2020-10-24 NOTE — Patient Instructions (Signed)
Check labs today  For now continue Lamictal  Call seizure activity  See you 6 months
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
## 2020-10-25 ENCOUNTER — Encounter: Payer: Self-pay | Admitting: Neurology

## 2020-10-26 LAB — COMPREHENSIVE METABOLIC PANEL
ALT: 15 IU/L (ref 0–32)
AST: 18 IU/L (ref 0–40)
Albumin/Globulin Ratio: 1.9 (ref 1.2–2.2)
Albumin: 4.5 g/dL (ref 3.8–4.8)
Alkaline Phosphatase: 72 IU/L (ref 44–121)
BUN/Creatinine Ratio: 10 (ref 9–23)
BUN: 11 mg/dL (ref 6–24)
Bilirubin Total: 0.3 mg/dL (ref 0.0–1.2)
CO2: 24 mmol/L (ref 20–29)
Calcium: 9.4 mg/dL (ref 8.7–10.2)
Chloride: 104 mmol/L (ref 96–106)
Creatinine, Ser: 1.15 mg/dL — ABNORMAL HIGH (ref 0.57–1.00)
GFR calc Af Amer: 66 mL/min/{1.73_m2} (ref >59)
GFR calc non Af Amer: 57 mL/min/{1.73_m2} — ABNORMAL LOW (ref >59)
Globulin, Total: 2.4 g/dL (ref 1.5–4.5)
Glucose: 89 mg/dL (ref 65–99)
Potassium: 4.6 mmol/L (ref 3.5–5.2)
Sodium: 140 mmol/L (ref 134–144)
Total Protein: 6.9 g/dL (ref 6.0–8.5)

## 2020-10-26 LAB — CBC WITH DIFFERENTIAL/PLATELET
Basophils Absolute: 0 10*3/uL (ref 0.0–0.2)
Basos: 1 %
EOS (ABSOLUTE): 0.1 10*3/uL (ref 0.0–0.4)
Eos: 1 %
Hematocrit: 40.4 % (ref 34.0–46.6)
Hemoglobin: 13.2 g/dL (ref 11.1–15.9)
Immature Grans (Abs): 0 10*3/uL (ref 0.0–0.1)
Immature Granulocytes: 1 %
Lymphocytes Absolute: 1.7 10*3/uL (ref 0.7–3.1)
Lymphs: 27 %
MCH: 26.2 pg — ABNORMAL LOW (ref 26.6–33.0)
MCHC: 32.7 g/dL (ref 31.5–35.7)
MCV: 80 fL (ref 79–97)
Monocytes Absolute: 0.7 10*3/uL (ref 0.1–0.9)
Monocytes: 11 %
Neutrophils Absolute: 3.8 10*3/uL (ref 1.4–7.0)
Neutrophils: 59 %
Platelets: 335 10*3/uL (ref 150–450)
RBC: 5.03 x10E6/uL (ref 3.77–5.28)
RDW: 13.2 % (ref 11.7–15.4)
WBC: 6.3 10*3/uL (ref 3.4–10.8)

## 2020-10-26 LAB — THYROID PANEL WITH TSH
Free Thyroxine Index: 2.2 (ref 1.2–4.9)
T3 Uptake Ratio: 24 % (ref 24–39)
T4, Total: 9.3 ug/dL (ref 4.5–12.0)
TSH: 0.91 u[IU]/mL (ref 0.450–4.500)

## 2020-10-26 LAB — LAMOTRIGINE LEVEL: Lamotrigine Lvl: 13.8 ug/mL (ref 2.0–20.0)

## 2020-10-30 ENCOUNTER — Telehealth: Payer: Self-pay | Admitting: Neurology

## 2020-10-30 MED ORDER — LAMOTRIGINE ER 100 MG PO TB24: 200.0000 mg | ORAL_TABLET | Freq: Every day | ORAL | 11 refills | Status: DC

## 2020-10-30 NOTE — Telephone Encounter (Signed)
I called patient.  I discussed her lab work results and recommendations.  Patient will stop Lamictal 100 mg twice a day and switch to extended release 200 mg at bedtime.  She will let us know if she has any further questions or concerns.  Patient verbalized understanding of results.

## 2020-10-30 NOTE — Telephone Encounter (Signed)
TSH panel is unremarkable,CMP shows mildly elevated creatinine 1.15, make sure drinking enough water.  Lamictal level was 13.8.  On 100 mg twice a day.  Let's try to switch to extended release 200 mg at bedtime, see if any less side effect.

## 2020-11-02 NOTE — Progress Notes (Signed)
I have reviewed and agreed above plan. 

## 2020-12-11 ENCOUNTER — Telehealth: Payer: 59 | Admitting: Nurse Practitioner

## 2020-12-11 DIAGNOSIS — B373 Candidiasis of vulva and vagina: Secondary | ICD-10-CM | POA: Diagnosis not present

## 2020-12-11 DIAGNOSIS — B3731 Acute candidiasis of vulva and vagina: Secondary | ICD-10-CM

## 2020-12-11 MED ORDER — FLUCONAZOLE 150 MG PO TABS
150.0000 mg | ORAL_TABLET | Freq: Once | ORAL | 0 refills | Status: AC
Start: 2020-12-11 — End: 2020-12-11

## 2020-12-11 NOTE — Addendum Note (Signed)
Addended by: Bennie Pierini on: 12/11/2020 08:03 PM   Modules accepted: Orders

## 2020-12-11 NOTE — Progress Notes (Signed)
We are sorry that you are not feeling well. Here is how we plan to help! Based on what you shared with me it looks like you: May have a yeast vaginosis  Vaginosis is an inflammation of the vagina that can result in discharge, itching and pain. The cause is usually a change in the normal balance of vaginal bacteria or an infection. Vaginosis can also result from reduced estrogen levels after menopause.  The most common causes of vaginosis are:   Bacterial vaginosis which results from an overgrowth of one on several organisms that are normally present in your vagina.   Yeast infections which are caused by a naturally occurring fungus called candida.   Vaginal atrophy (atrophic vaginosis) which results from the thinning of the vagina from reduced estrogen levels after menopause.   Trichomoniasis which is caused by a parasite and is commonly transmitted by sexual intercourse.  Factors that increase your risk of developing vaginosis include: Marland Kitchen Medications, such as antibiotics and steroids . Uncontrolled diabetes . Use of hygiene products such as bubble bath, vaginal spray or vaginal deodorant . Douching . Wearing damp or tight-fitting clothing . Using an intrauterine device (IUD) for birth control . Hormonal changes, such as those associated with pregnancy, birth control pills or menopause . Sexual activity . Having a sexually transmitted infection  Your treatment plan is A single Diflucan (fluconazole) 150mg  tablet once.  I have electronically sent this prescription into the pharmacy that you have chosen. You can use monistat OTC topically.  Be sure to take all of the medication as directed. Stop taking any medication if you develop a rash, tongue swelling or shortness of breath. Mothers who are breast feeding should consider pumping and discarding their breast milk while on these antibiotics. However, there is no consensus that infant exposure at these doses would be harmful.  Remember that  medication creams can weaken latex condoms.   HOME CARE:  Good hygiene may prevent some types of vaginosis from recurring and may relieve some symptoms:  . Avoid baths, hot tubs and whirlpool spas. Rinse soap from your outer genital area after a shower, and dry the area well to prevent irritation. Don't use scented or harsh soaps, such as those with deodorant or antibacterial action. Marland Kitchen Avoid irritants. These include scented tampons and pads. . Wipe from front to back after using the toilet. Doing so avoids spreading fecal bacteria to your vagina.  Other things that may help prevent vaginosis include:  Marland Kitchen Don't douche. Your vagina doesn't require cleansing other than normal bathing. Repetitive douching disrupts the normal organisms that reside in the vagina and can actually increase your risk of vaginal infection. Douching won't clear up a vaginal infection. . Use a latex condom. Both female and female latex condoms may help you avoid infections spread by sexual contact. . Wear cotton underwear. Also wear pantyhose with a cotton crotch. If you feel comfortable without it, skip wearing underwear to bed. Yeast thrives in Marland Kitchen Your symptoms should improve in the next day or two.  GET HELP RIGHT AWAY IF:  . You have pain in your lower abdomen ( pelvic area or over your ovaries) . You develop nausea or vomiting . You develop a fever . Your discharge changes or worsens . You have persistent pain with intercourse . You develop shortness of breath, a rapid pulse, or you faint.  These symptoms could be signs of problems or infections that need to be evaluated by a medical provider now.  MAKE SURE YOU    Understand these instructions.  Will watch your condition.  Will get help right away if you are not doing well or get worse.  Your e-visit answers were reviewed by a board certified advanced clinical practitioner to complete your personal care plan. Depending upon the  condition, your plan could have included both over the counter or prescription medications. Please review your pharmacy choice to make sure that you have choses a pharmacy that is open for you to pick up any needed prescription, Your safety is important to [place]. If you have drug allergies check your prescription carefully.   You can use MyChart to ask questions about today's visit, request a non-urgent call back, or ask for a work or school excuse for 24 hours related to this e-Visit. If it has been greater than 24 hours you will need to follow up with your provider, or enter a new e-Visit to address those concerns. You will get a MyChart message within the next two days asking about your experience. I hope that your e-visit has been valuable and will speed your recovery.  5-10 minutes spent reviewing and documenting in chart.

## 2021-01-28 ENCOUNTER — Telehealth: Payer: Self-pay | Admitting: Neurology

## 2021-01-28 NOTE — Telephone Encounter (Signed)
Spoke to [employer] last Rx transferred out by pt, but not sure what pharmacy.  I LMVM for pt to return call back after 1300.

## 2021-01-28 NOTE — Telephone Encounter (Signed)
Pt called stating that she is needing the MD Dr. Terrace Arabia to write out the Rx for her LamoTRIgine 100 MG TB24 24 hour tablet. Pt's insurance informed her that the MD is in network but the NP is not so therefore the pharmacy can not fill the Rx until it is written by the MD. Please advise.

## 2021-01-28 NOTE — Telephone Encounter (Signed)
Pt returned phone call. Would [facility]TRIgine 100 MG TB24 24 [facility] [address] [place] [address]

## 2021-01-28 NOTE — Telephone Encounter (Signed)
Called [facility] and needs PA for lamotrigine 24 hour tablet.

## 2021-01-28 NOTE — Telephone Encounter (Addendum)
PA completed urgently over the phone with [employer] ([phone]). I could not get a new case to load in covermymeds. KJ#[id number]. [id number]. Decision pending.

## 2021-01-29 NOTE — Telephone Encounter (Signed)
PA approved through 01/28/2022.

## 2021-01-30 ENCOUNTER — Ambulatory Visit: Payer: 59 | Admitting: Medical

## 2021-02-06 ENCOUNTER — Other Ambulatory Visit: Payer: Self-pay | Admitting: Obstetrics and Gynecology

## 2021-02-06 DIAGNOSIS — Z1231 Encounter for screening mammogram for malignant neoplasm of breast: Secondary | ICD-10-CM

## 2021-03-01 ENCOUNTER — Ambulatory Visit: Payer: 59

## 2021-03-01 ENCOUNTER — Ambulatory Visit: Payer: 59 | Admitting: Certified Nurse Midwife

## 2021-03-04 ENCOUNTER — Other Ambulatory Visit: Payer: Self-pay | Admitting: Neurology

## 2021-03-04 MED ORDER — LAMOTRIGINE ER 100 MG PO TB24: 200.0000 mg | ORAL_TABLET | Freq: Every day | ORAL | 2 refills | Status: DC

## 2021-03-04 NOTE — Telephone Encounter (Signed)
Patient called after hours call center for refill on her lamotrigine.  She had 11 refills with a prescription renewed in February 2022 by Margie Ege, NP.  She requested a refill to be sent to [facility] pharmacy.  I renewed her prescription, please review and change prescription to [facility] long-term if appropriate.

## 2021-03-05 MED ORDER — LAMOTRIGINE 100 MG PO TABS: 100.0000 mg | ORAL_TABLET | Freq: Two times a day (BID) | ORAL | 11 refills | Status: DC

## 2021-03-05 NOTE — Addendum Note (Signed)
Addended by: Guy Begin on: 03/05/2021 10:31 AM   Modules accepted: Orders

## 2021-03-05 NOTE — Telephone Encounter (Addendum)
I called pharmacy.  [facility] [address]  Patient cannot afford the lamotrigine extended release tablets.  It is $65 with the discount.  She needs the plain lamotrigine.  I called patient, she apologized for not giving [place] all the correct information.  But she needs to go back on the plain lamotrigine because even with the coupon it still too expensive for her so I placed a new prescription lamotrigine 100 mg tablets 1 twice a day to [facility].  Patient appreciated call back.

## 2021-03-25 ENCOUNTER — Telehealth: Payer: 59 | Admitting: Physician Assistant

## 2021-03-25 DIAGNOSIS — H00015 Hordeolum externum left lower eyelid: Secondary | ICD-10-CM

## 2021-03-25 MED ORDER — ERYTHROMYCIN 5 MG/GM OP OINT: 1.0000 "application " | TOPICAL_OINTMENT | Freq: Every day | OPHTHALMIC | 0 refills | Status: DC

## 2021-03-25 NOTE — Progress Notes (Signed)
  E-Visit for Stye   We are sorry that you are not feeling well. Here is how we plan to help!  Based on what you have shared with me it looks like you have a stye.  A stye is an inflammation of the eyelid.  It is often a red, painful lump near the edge of the eyelid that may look like a boil or a pimple.  A stye develops when an infection occurs at the base of an eyelash.   We have made appropriate suggestions for you based upon your presentation: Simple styes can be treated without medical intervention.  Most styes either resolve spontaneously or resolve with simple home treatment by applying warm compresses or heated washcloth to the stye for about 10-15 minutes three to four times a day. This causes the stye to drain and resolve. However, being your symptoms have been present for such a long time, I will prescribe Erythromycin eye ointment. Apply to affected eye nightly x 5 days.  HOME CARE:  Wash your hands often! Let the stye open on its own. Don't squeeze or open it. Don't rub your eyes. This can irritate your eyes and let in bacteria.  If you need to touch your eyes, wash your hands first. Don't wear eye makeup or contact lenses until the area has healed.  GET HELP RIGHT AWAY IF:  Your symptoms do not improve. You develop blurred or loss of vision. Your symptoms worsen (increased discharge, pain or redness).   Thank you for choosing an e-visit.  Your e-visit answers were reviewed by a board certified advanced clinical practitioner to complete your personal care plan. Depending upon the condition, your plan could have included both over the counter or prescription medications.  Please review your pharmacy choice. Make sure the pharmacy is open so you can pick up prescription now. If there is a problem, you may contact your provider through [employer] and have the prescription routed to another pharmacy.  Your safety is important to [place]. If you have drug allergies check your  prescription carefully.   For the next 24 hours you can use MyChart to ask questions about today's visit, request a non-urgent call back, or ask for a work or school excuse. You will get an email in the next two days asking about your experience. I hope that your e-visit has been valuable and will speed your recovery.   I provided 5 minutes of non face-to-face time during this encounter for chart review and documentation.

## 2021-04-24 ENCOUNTER — Other Ambulatory Visit: Payer: Self-pay

## 2021-04-24 ENCOUNTER — Ambulatory Visit (INDEPENDENT_AMBULATORY_CARE_PROVIDER_SITE_OTHER): Payer: 59 | Admitting: Certified Nurse Midwife

## 2021-04-24 ENCOUNTER — Encounter: Payer: Self-pay | Admitting: Certified Nurse Midwife

## 2021-04-24 ENCOUNTER — Ambulatory Visit: Payer: 59 | Admitting: Neurology

## 2021-04-24 VITALS — BP 120/82 | HR 83 | Ht 63.0 in | Wt 195.8 lb

## 2021-04-24 DIAGNOSIS — N898 Other specified noninflammatory disorders of vagina: Secondary | ICD-10-CM

## 2021-04-24 DIAGNOSIS — K5909 Other constipation: Secondary | ICD-10-CM

## 2021-04-24 DIAGNOSIS — Z01419 Encounter for gynecological examination (general) (routine) without abnormal findings: Secondary | ICD-10-CM

## 2021-04-24 DIAGNOSIS — Z1239 Encounter for other screening for malignant neoplasm of breast: Secondary | ICD-10-CM | POA: Diagnosis not present

## 2021-04-24 DIAGNOSIS — N3946 Mixed incontinence: Secondary | ICD-10-CM

## 2021-04-24 MED ORDER — MAGNESIUM OXIDE -MG SUPPLEMENT 200 MG PO TABS: 400.0000 mg | ORAL_TABLET | Freq: Every day | ORAL | 3 refills | Status: AC

## 2021-04-24 NOTE — Progress Notes (Signed)
GYNECOLOGY CLINIC ANNUAL PREVENTATIVE CARE ENCOUNTER NOTE  Subjective:   Faith Oconnor is a 45 y.o. [id number] female here for a routine annual gynecologic exam.  Current complaints: Vaginal dryness and irritation that started in June. She also endorses occasional bladder leakage. She states that she believes that her bladder is fully empty after urination and then "more leaks out". She endorses self-breast exams and occasional hot flashes and night sweats. Pt desires a PAP screen today. She has concerns for cervical cancer based on familial relatives with history of breast and cervical cancer. Pt denies breast complaints. Denies abnormal vaginal bleeding, discharge, pelvic pain, problems with intercourse or other gynecologic concerns.    Gynecologic History No LMP recorded. (Menstrual status: IUD). She reports monthly vaginal spotting, but no other menstrual symptoms.  Contraception: IUD Last Pap: 2021. Results were: normal Last mammogram: 03/09/20. Results were: normal  Obstetric History OB History  Gravida Para Term Preterm AB Living  5 2     2 2   SAB IAB Ectopic Multiple Live Births  1 1     2     # Outcome Date GA Lbr Len/2nd Weight Sex Delivery Anes PTL Lv  5 Gravida           4 Para           3 Para           2 IAB           1 SAB             Past Medical History:  Diagnosis Date   Anxiety    Hypertension    Seizures (HCC)     Past Surgical History:  Procedure Laterality Date   NO PAST SURGERIES      Current Outpatient Medications on File Prior to Visit  Medication Sig Dispense Refill   albuterol (VENTOLIN HFA) 108 (90 Base) MCG/ACT inhaler Inhale 2 puffs into the lungs every 6 (six) hours as needed for wheezing or shortness of breath. 8 g 0   cyclobenzaprine (FLEXERIL) 10 MG tablet Take 1 tablet (10 mg total) by mouth 3 (three) times daily as needed for muscle spasms. 30 tablet 0   fluticasone (FLONASE) 50 MCG/ACT nasal spray Place 2 sprays into both nostrils  daily. 16 g 6   lamoTRIgine (LAMICTAL) 100 MG tablet Take 1 tablet (100 mg total) by mouth 2 (two) times daily. 60 tablet 11   levonorgestrel (MIRENA) 20 MCG/24HR IUD 1 each by Intrauterine route once.     lisinopril-hydrochlorothiazide (ZESTORETIC) 10-12.5 MG tablet Take 1 tablet by mouth daily. 90 tablet 1   LORazepam (ATIVAN) 0.5 MG tablet Take 1 tablet (0.5 mg total) by mouth 2 (two) times daily as needed for anxiety. 30 tablet 1   meclizine (ANTIVERT) 25 MG tablet Take 25 mg by mouth 3 (three) times daily as needed for dizziness.      triamcinolone (KENALOG) 0.025 % cream Apply 1 application topically 2 (two) times daily as needed (for itching).      valACYclovir (VALTREX) 1000 MG tablet Take 2 tablets (2,000 mg total) by mouth 2 (two) times daily. For 1 day 4 tablet 0   benzonatate (TESSALON PERLES) 100 MG capsule Take 1 capsule (100 mg total) by mouth 3 (three) times daily as needed. 20 capsule 0   cefdinir (OMNICEF) 300 MG capsule Take 1 capsule (300 mg total) by mouth 2 (two) times daily. (Patient not taking: Reported on 04/24/2021) 14 capsule 0   erythromycin ophthalmic  ointment Place 1 application into the left eye at bedtime. 3.5 g 0   levETIRAcetam (KEPPRA) 500 MG tablet Take 1 tablet (500 mg total) by mouth 2 (two) times daily. (Patient not taking: Reported on 04/24/2021) 180 tablet 4   sertraline (ZOLOFT) 25 MG tablet Take 1 tablet (25 mg total) by mouth at bedtime. (Patient not taking: Reported on 04/24/2021) 90 tablet 1   No current facility-administered medications on file prior to visit.    Allergies  Allergen Reactions   Penicillins Itching, Swelling and Other (See Comments)    Vaginal irritation/swelling/severe itching Did it involve swelling of the face/tongue/throat, SOB, or low BP? No Did it involve sudden or severe rash/hives, skin peeling, or any reaction on the inside of your mouth or nose? No Did you need to seek medical attention at a hospital or doctor's office?  No When did it last happen? Adulthood If all above answers are "NO", may proceed with cephalosporin use.    Clindamycin Swelling and Rash    Vaginal irritation/swelling    Social History   Socioeconomic History   Marital status: Married    Spouse name: Not on file   Number of children: Not on file   Years of education: Not on file   Highest education level: Not on file  Occupational History   Not on file  Tobacco Use   Smoking status: Never   Smokeless tobacco: Never  Vaping Use   Vaping Use: Never used  Substance and Sexual Activity   Alcohol use: No   Drug use: No   Sexual activity: Yes    Partners: Male    Birth control/protection: I.U.D.    Comment: Husband  Other Topics Concern   Not on file  Social History Narrative   Not on file   Social Determinants of Health   Financial Resource Strain: Not on file  Food Insecurity: Not on file  Transportation Needs: Not on file  Physical Activity: Not on file  Stress: Not on file  Social Connections: Not on file  Intimate Partner Violence: Not on file    Family History  Problem Relation Age of Onset   Hypertension Mother    Osteoarthritis Mother    Hyperlipidemia Mother    Hypertension Father    Diabetes Mellitus II Father    Hyperlipidemia Father    Hypertension Sister    Sleep apnea Sister    Depression Sister    Breast cancer Maternal Grandmother 20   Breast cancer Maternal Aunt 30       Metastasized to her bones   Pancreatic cancer Maternal Aunt    Depression Sister    Hypertension Sister    Epilepsy Daughter    Migraines Daughter     The following portions of the patient's history were reviewed and updated as appropriate: allergies, current medications, past family history, past medical history, past social history, past surgical history and problem list.  Review of Systems Constitutional: negative Respiratory: negative Cardiovascular: negative Gastrointestinal: negative except for change in  bowel habits and constipation Genitourinary:negative except for hot flashes and vaginal dryness, irritation, and stress incontinence.  Integument/breast: negative Neurological: negative Behavioral/Psych: negative Allergic/Immunologic: negative   Objective:  BP 120/82   Pulse 83   Ht 5\' 3"  (1.6 m)   Wt 88.8 kg   BMI 34.68 kg/m  CONSTITUTIONAL: Well-developed, well-nourished female in no acute distress.  HENT:  Normocephalic, atraumatic, External right and left ear normal. Oropharynx is clear and moist EYES: Conjunctivae and EOM are  normal. Pupils are equal, round, and reactive to light. No scleral icterus.  NECK: Normal range of motion, supple, no masses.  Normal thyroid.  SKIN: Skin is warm and dry. No rash noted. Not diaphoretic. No erythema. No pallor. NEUROLGIC: Alert and oriented to person, place, and time. Normal reflexes, muscle tone coordination. No cranial nerve deficit noted. PSYCHIATRIC: Normal mood and affect. Normal behavior. Normal judgment and thought content. CARDIOVASCULAR: Normal heart rate noted, regular rhythm RESPIRATORY: Clear to auscultation bilaterally. Effort and breath sounds normal, no problems with respiration noted. BREASTS: Symmetric in size. No masses, skin changes, nipple drainage, or lymphadenopathy. ABDOMEN: Soft, normal bowel sounds, no distention noted.  No tenderness, rebound or guarding.  PELVIC: exam deferred MUSCULOSKELETAL: Normal range of motion. No tenderness.  No cyanosis, clubbing, or edema.  2+ distal pulses.   Assessment & Plan:  Women's annual routine gynecological examination  - Saanya is doing well overall.  - Extensive education provided on the decreased risk of HPV and cervical cancer at current age and the lack of necessity for annual PAP smears. Pt voiced understanding, and is agreeable with plan to repeat in 2 years (compromised for q3 vs annual or q5).   Breast Screening - Has mammogram scheduled for September 2022.   Urge  and stress incontinence - Referral placed for pelvic floor therapy.   Vaginal Dryness - Recommended daily oral use of evening primrose oil, may also use vaginal moisturizer daily. Encouraged to return the the office if symptoms do not improve.   Other Constipation - Recommended use of oral nightly magnesium oxide for constipation. Discussed other age-related beneficial properties to daily magnesium.    Routine preventative health maintenance measures emphasized. Please refer to After Visit Summary for other counseling recommendations.    Sandra Cockayne M, Student-MidWife  Yameli Delamater Danella Deis) Suzie Portela, BSN, RNC-OB  Student Nurse-Midwife   04/24/2021  1:35 PM

## 2021-04-24 NOTE — Patient Instructions (Addendum)
Take evening primrose oil daily for vaginal dryness, may also use a daily vaginal moisturizer.

## 2021-04-24 NOTE — Progress Notes (Signed)
Pt states is having Urinary incontinence.

## 2021-04-25 ENCOUNTER — Encounter: Payer: Self-pay | Admitting: Certified Nurse Midwife

## 2021-05-13 IMAGING — MG MM DIGITAL DIAGNOSTIC UNILAT*R* W/ TOMO W/ CAD
4 series · 4 of 12 positions shown · non-contrast
Comparison: Previous exam(s).

CLINICAL DATA: Screening recall for a possible mass in the right
breast.

EXAM:
DIGITAL DIAGNOSTIC RIGHT MAMMOGRAM WITH CAD AND TOMO
ULTRASOUND RIGHT BREAST

[R CC synth-2D]
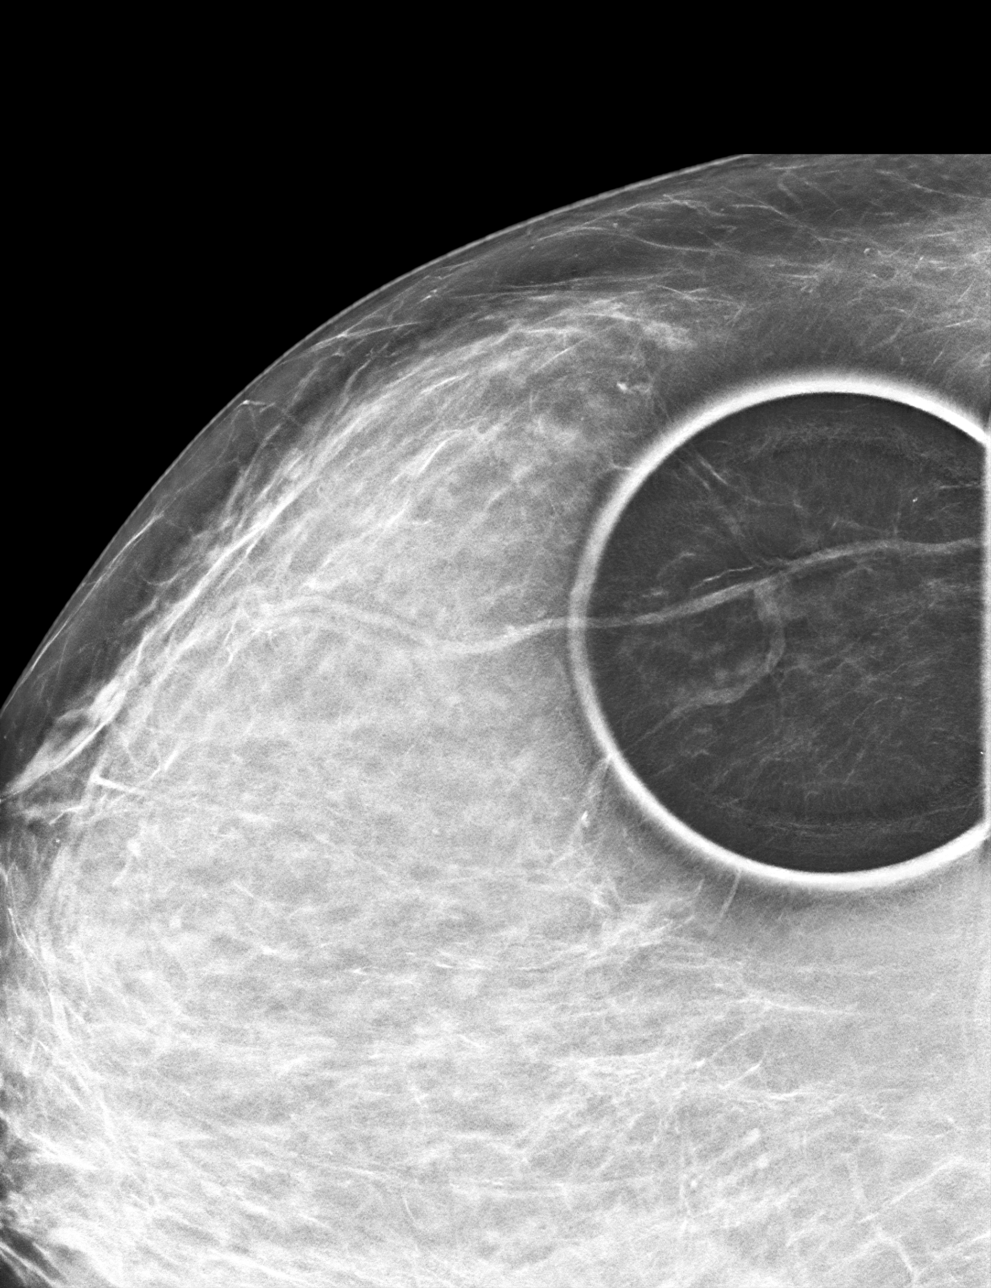

[R MLO synth-2D]
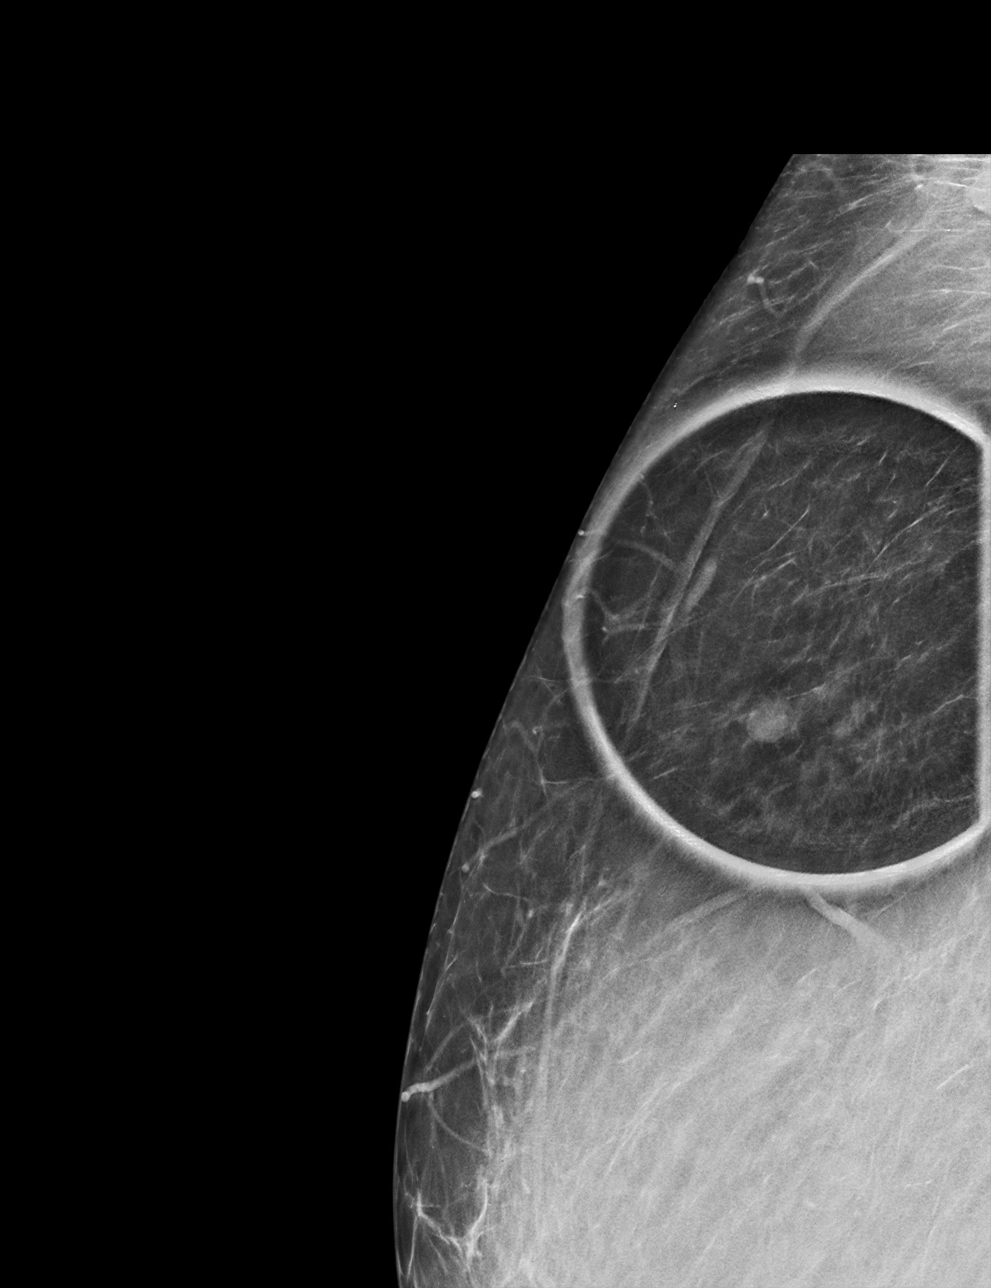

[R CC tomo · tomo slice 33/66.0]
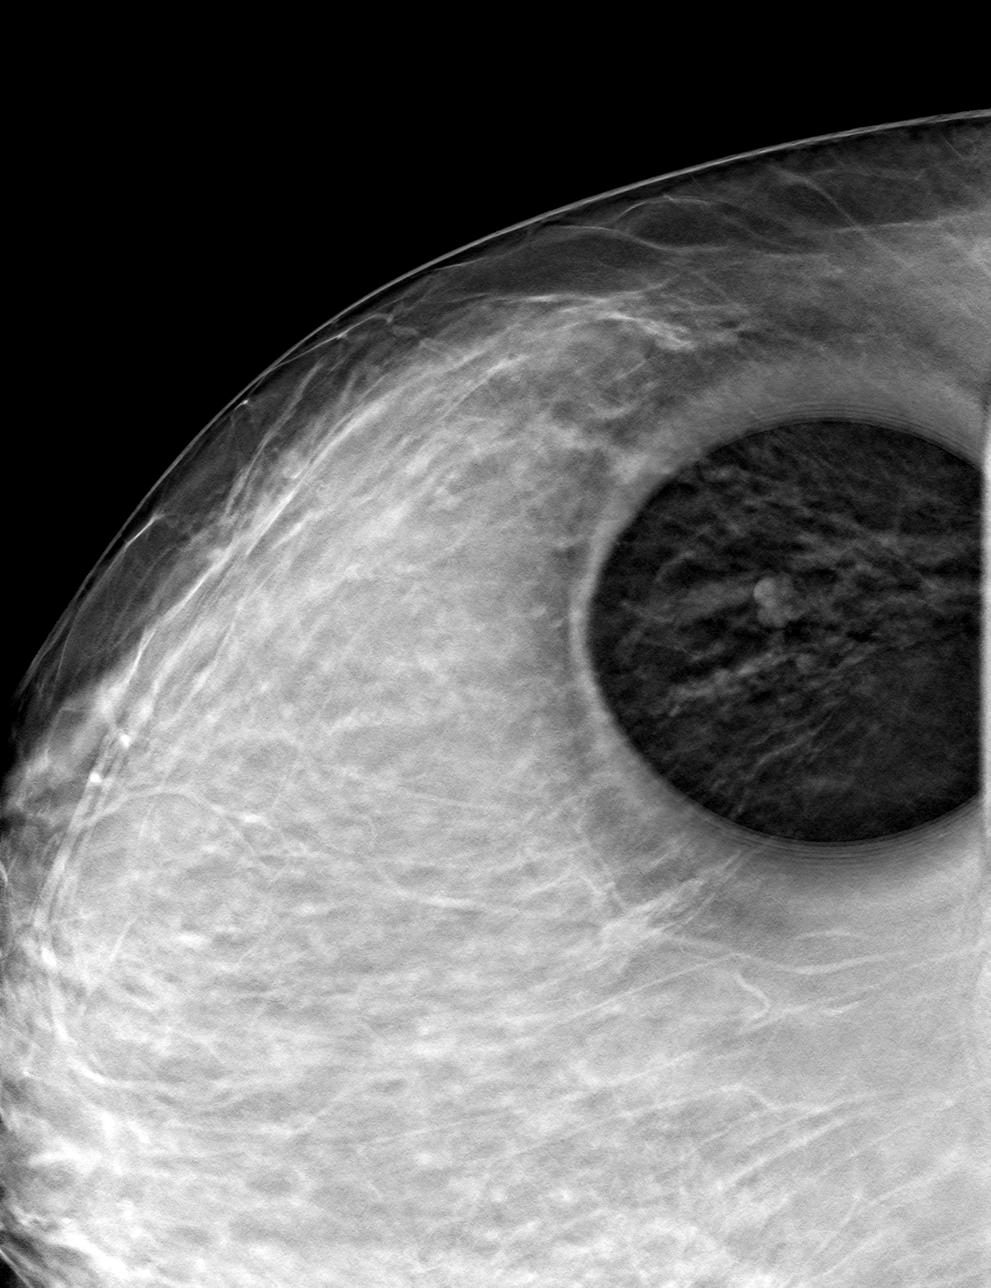

[R MLO tomo · tomo slice 35/69.0]
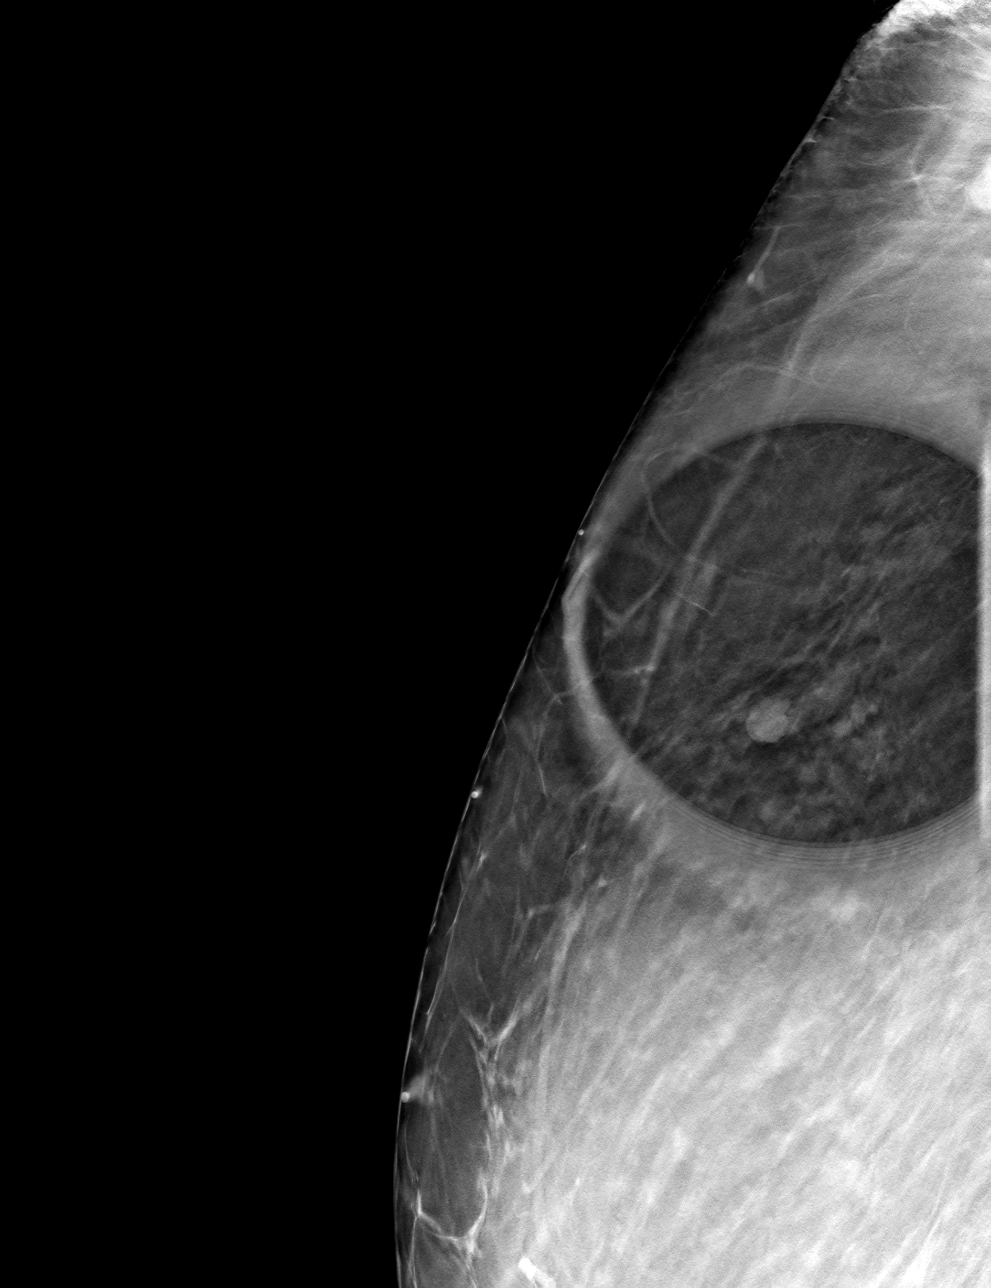

[4 of 12 positions shown; findings below may reference images not displayed]

ACR Breast Density Category b: There are scattered areas of
fibroglandular density.
FINDINGS: Possible mass in the upper outer right breast persists as a smoothly
lobulated, 7-8 mm, circumscribed mass.

Mammographic images were processed with CAD.

On physical exam, no mass is palpated in the upper outer right
breast.

Targeted ultrasound is performed, showing a lobulated cyst in the
right breast at 10 o'clock, 10 cm the nipple, measuring 10 x 5 x 8
mm, consistent in size, shape and location to the mammographic mass.
There are no solid masses or suspicious lesions.
IMPRESSION: 1. No evidence of breast malignancy.
2. Benign right breast cyst.

RECOMMENDATION:
Screening mammogram in one year.(Code:1U-Z-55G)

I have discussed the findings and recommendations with the patient.
If applicable, a reminder letter will be sent to the patient
regarding the next appointment.

BI-RADS CATEGORY  2: Benign.

## 2021-05-13 IMAGING — US US BREAST*R* LIMITED INC AXILLA
1 series · 8 of 8 positions shown · non-contrast
Comparison: Previous exam(s).

CLINICAL DATA: Screening recall for a possible mass in the right
breast.

EXAM:
DIGITAL DIAGNOSTIC RIGHT MAMMOGRAM WITH CAD AND TOMO
ULTRASOUND RIGHT BREAST

[Series 1: us breast*right* limited inc axilla · 0.07mm/px · 8 of 8 slices shown]
[im 1/8]
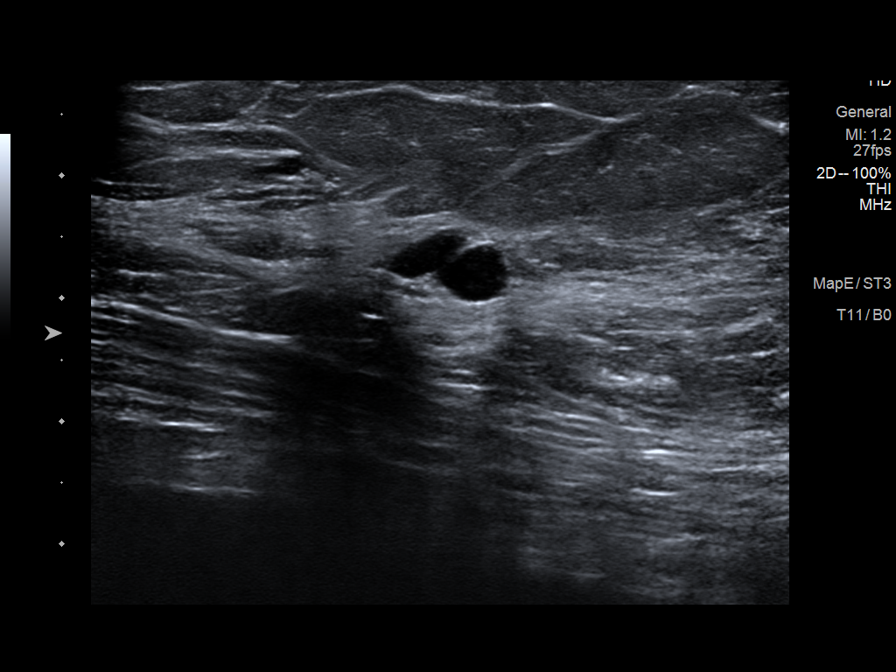
[im 2/8]
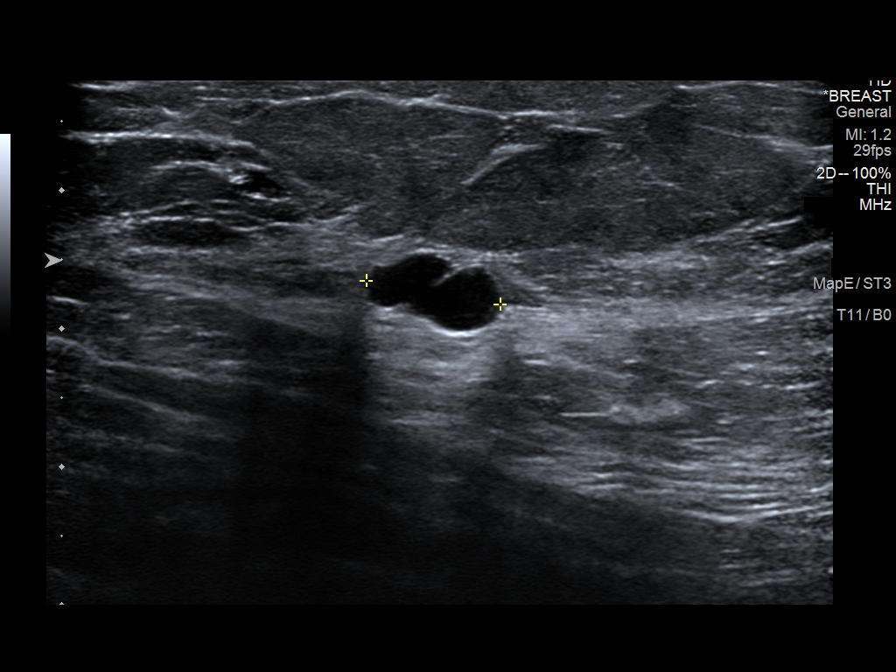
[im 3/8]
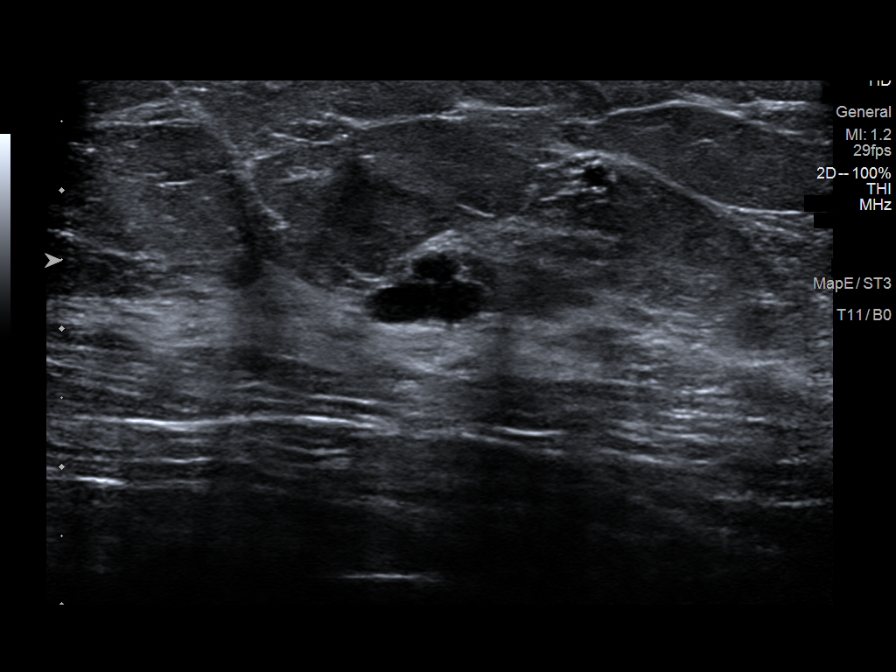
[im 4/8]
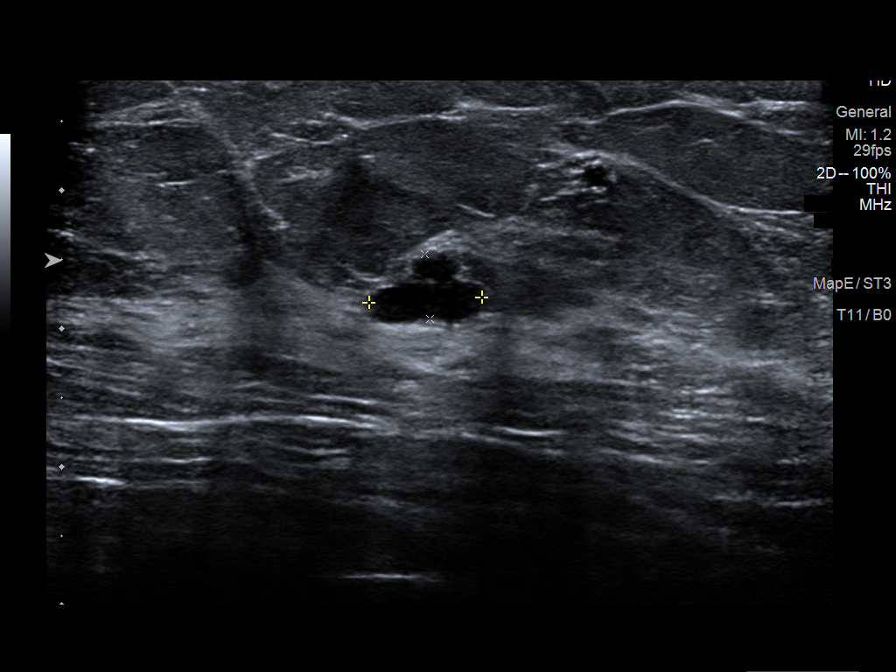
[im 5/8]
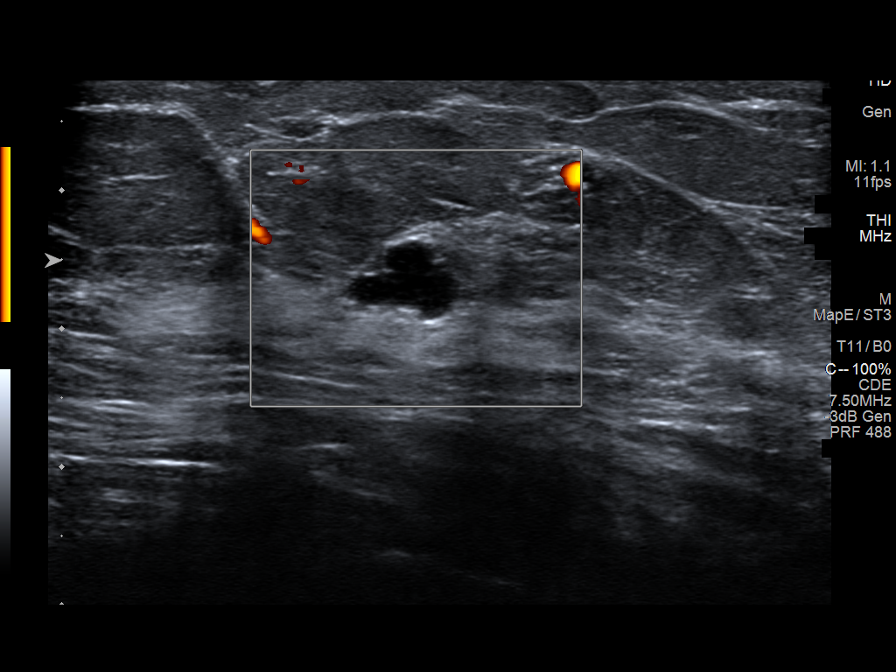
[im 6/8]
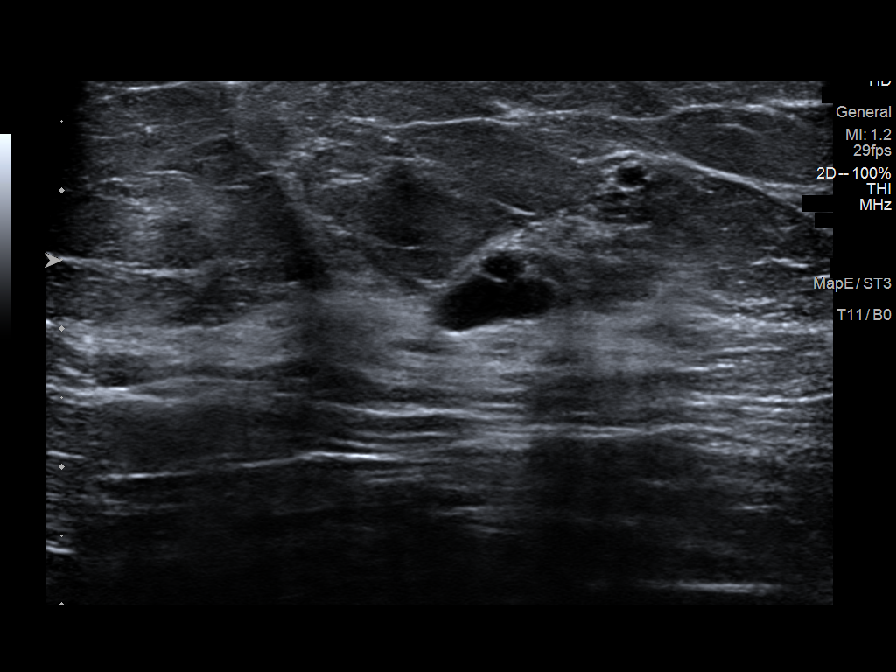
[im 7/8]
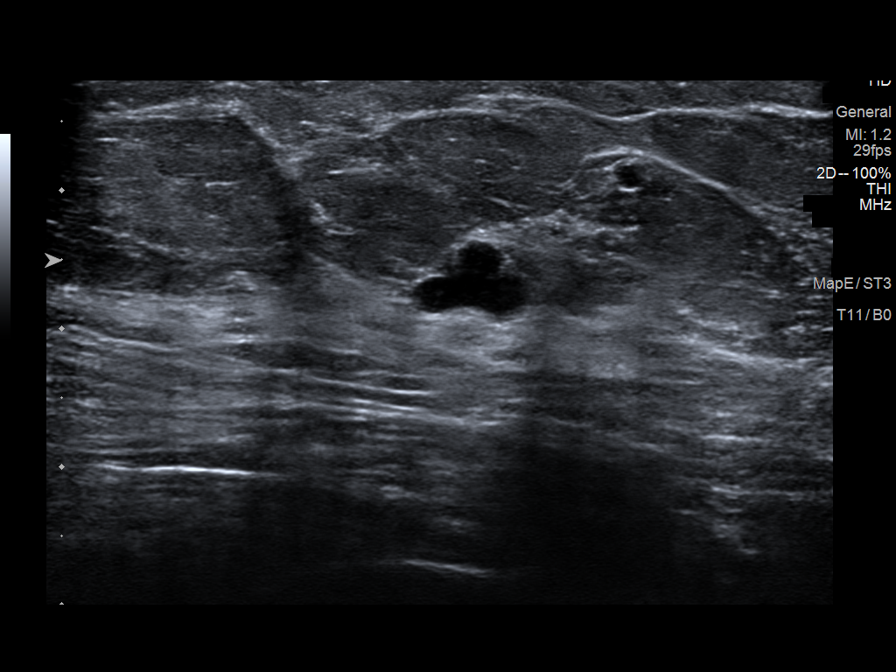
[im 8/8]
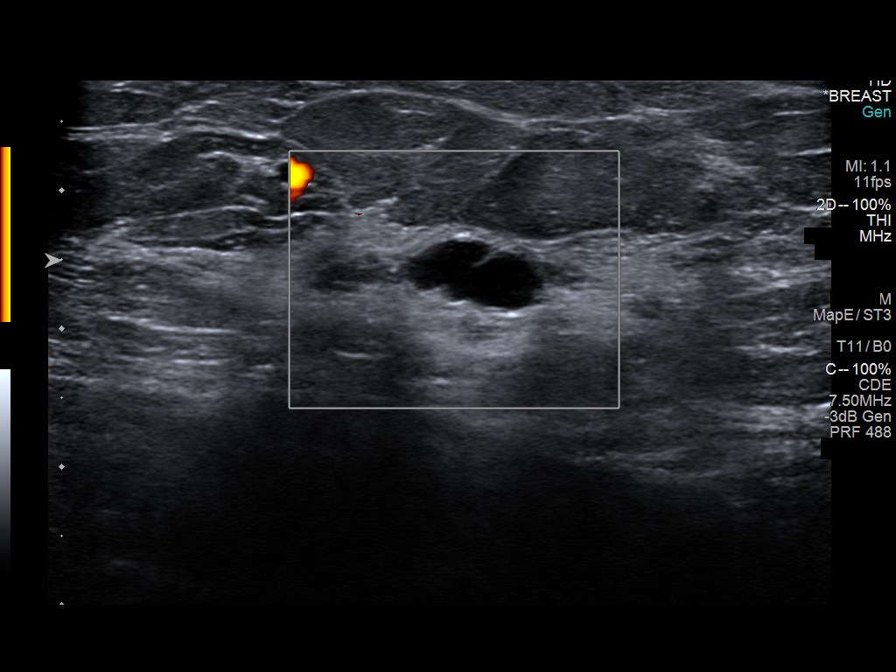

[8 of 8 positions shown; findings below may reference images not displayed]

ACR Breast Density Category b: There are scattered areas of
fibroglandular density.
FINDINGS: Possible mass in the upper outer right breast persists as a smoothly
lobulated, 7-8 mm, circumscribed mass.

Mammographic images were processed with CAD.

On physical exam, no mass is palpated in the upper outer right
breast.

Targeted ultrasound is performed, showing a lobulated cyst in the
right breast at 10 o'clock, 10 cm the nipple, measuring 10 x 5 x 8
mm, consistent in size, shape and location to the mammographic mass.
There are no solid masses or suspicious lesions.
IMPRESSION: 1. No evidence of breast malignancy.
2. Benign right breast cyst.

RECOMMENDATION:
Screening mammogram in one year.(Code:[id number]-55G)

I have discussed the findings and recommendations with the patient.
If applicable, a reminder letter will be sent to the patient
regarding the next appointment.

BI-RADS CATEGORY  2: Benign.

## 2021-05-20 ENCOUNTER — Telehealth: Payer: 59 | Admitting: Physician Assistant

## 2021-05-20 DIAGNOSIS — S8990XA Unspecified injury of unspecified lower leg, initial encounter: Secondary | ICD-10-CM

## 2021-05-21 NOTE — Progress Notes (Signed)
Based on what you shared with me, I feel your condition warrants further evaluation and I recommend that you be seen in a face to face visit. Giving injury to the knee with difficulty bearing weight and swelling, you need an in-person evaluation either at PCP office or at local Urgent Care so that you get the proper diagnosis and treatment.    NOTE: There will be NO CHARGE for this eVisit   If you are having a true medical emergency please call 911.      For an urgent face to face visit, [facility] has six urgent care centers for your convenience:     [facility] at [facility] Directions [phone] [address] [place], [place] [address]    [facility]) Get Driving Directions [phone] [address] [place], [place] [address]  [facility]) Get Driving Directions [phone] [address] [place],  [place]  [address]  [facility] at [facility] Get Driving Directions [phone] [address] [place], [place] [address]   [facility] at [facility] Get Driving Directions  [phone] [address] [place], [place] [address]   [facility] at [facility] Directions [phone] [address], [address] [place], [place] [address]  Your MyChart E-visit questionnaire answers were reviewed by a board certified advanced clinical practitioner to complete your personal care plan based on your specific symptoms.  Thank you for using e-Visits.

## 2021-06-04 ENCOUNTER — Ambulatory Visit
Admission: RE | Admit: 2021-06-04 | Discharge: 2021-06-04 | Disposition: A | Discharge status: Home / Self Care | Payer: 59 | Source: Ambulatory Visit | Attending: Obstetrics and Gynecology | Admitting: Obstetrics and Gynecology

## 2021-06-04 ENCOUNTER — Other Ambulatory Visit: Payer: Self-pay

## 2021-06-04 DIAGNOSIS — Z1231 Encounter for screening mammogram for malignant neoplasm of breast: Secondary | ICD-10-CM

## 2021-06-10 ENCOUNTER — Telehealth: Payer: 59 | Admitting: Family Medicine

## 2021-06-10 DIAGNOSIS — J019 Acute sinusitis, unspecified: Secondary | ICD-10-CM

## 2021-06-10 DIAGNOSIS — B001 Herpesviral vesicular dermatitis: Secondary | ICD-10-CM

## 2021-06-10 MED ORDER — DOXYCYCLINE HYCLATE 100 MG PO TABS: 100.0000 mg | ORAL_TABLET | Freq: Two times a day (BID) | ORAL | 0 refills | Status: DC

## 2021-06-10 MED ORDER — VALACYCLOVIR HCL 1 G PO TABS: 2000.0000 mg | ORAL_TABLET | Freq: Two times a day (BID) | ORAL | 0 refills | Status: AC

## 2021-06-10 MED ORDER — LEVOFLOXACIN 500 MG PO TABS: 500.0000 mg | ORAL_TABLET | Freq: Every day | ORAL | 0 refills | Status: AC

## 2021-06-10 NOTE — Progress Notes (Signed)

## 2021-06-10 NOTE — Addendum Note (Signed)
Addended by: Freddy Finner on: 06/10/2021 01:52 PM   Modules accepted: Orders

## 2021-06-10 NOTE — Progress Notes (Addendum)

## 2021-06-18 ENCOUNTER — Ambulatory Visit: Payer: 59 | Admitting: Neurology

## 2021-06-22 ENCOUNTER — Telehealth: Payer: 59 | Admitting: Nurse Practitioner

## 2021-06-22 DIAGNOSIS — R42 Dizziness and giddiness: Secondary | ICD-10-CM

## 2021-06-22 DIAGNOSIS — J00 Acute nasopharyngitis [common cold]: Secondary | ICD-10-CM | POA: Diagnosis not present

## 2021-06-22 MED ORDER — MECLIZINE HCL 25 MG PO TABS: 25.0000 mg | ORAL_TABLET | Freq: Three times a day (TID) | ORAL | 0 refills | Status: DC | PRN

## 2021-06-22 NOTE — Progress Notes (Signed)

## 2021-07-23 ENCOUNTER — Telehealth: Payer: 59 | Admitting: Family

## 2021-07-23 DIAGNOSIS — M546 Pain in thoracic spine: Secondary | ICD-10-CM | POA: Diagnosis not present

## 2021-07-23 MED ORDER — NAPROXEN 500 MG PO TABS: 500.0000 mg | ORAL_TABLET | Freq: Two times a day (BID) | ORAL | 0 refills | Status: DC

## 2021-07-23 MED ORDER — BACLOFEN 10 MG PO TABS
10.0000 mg | ORAL_TABLET | Freq: Three times a day (TID) | ORAL | 0 refills | Status: DC
Start: 2021-07-23 — End: 2023-02-24

## 2021-07-23 NOTE — Progress Notes (Signed)

## 2021-08-02 ENCOUNTER — Encounter: Payer: Self-pay | Admitting: Physical Therapy

## 2021-08-02 ENCOUNTER — Ambulatory Visit: Payer: 59 | Attending: Certified Nurse Midwife | Admitting: Physical Therapy

## 2021-08-02 ENCOUNTER — Other Ambulatory Visit: Payer: Self-pay

## 2021-08-02 DIAGNOSIS — M6281 Muscle weakness (generalized): Secondary | ICD-10-CM | POA: Diagnosis present

## 2021-08-02 DIAGNOSIS — R278 Other lack of coordination: Secondary | ICD-10-CM | POA: Insufficient documentation

## 2021-08-02 NOTE — Therapy (Addendum)
[place] @ [address] [place], [place], [address] Phone: [phone]   Fax:  [phone]  Physical Therapy Evaluation  Patient Details  Name: Faith Oconnor MRN: [id number] Date of Birth: [birth year] Referring Provider (PT): [place] R. Gilford Rile, CNM   Encounter Date: 08/02/2021   PT End of Session - 08/02/21 0824     Visit Number 1    Date for PT Re-Evaluation 09/27/21    Authorization Type Medicaid    PT Start Time 0800    PT Stop Time 0835    PT Time Calculation (min) 35 min    Activity Tolerance Patient tolerated treatment well    Behavior During Therapy [facility] for tasks assessed/performed             Past Medical History:  Diagnosis Date   Anxiety    Hypertension    Low back strain, subsequent encounter 05/17/2018   Seizures ([place])     Past Surgical History:  Procedure Laterality Date   NO PAST SURGERIES      There were no vitals filed for this visit.    Subjective Assessment - 08/02/21 0808     Subjective Patient reports 1 year ago she has been having urinary leakage. She wears a thicker pad.    Patient Stated Goals reduce leakage    Currently in Pain? No/denies    Multiple Pain Sites No                [facility] PT Assessment - 08/02/21 0001       Assessment   Medical Diagnosis N39.46 Urge and stress incontinence    Referring Provider (PT) Jamilla R. Walker, CNM    Onset Date/Surgical Date --   08/02/2020   Prior Therapy none      Precautions   Precautions None      Restrictions   Weight Bearing Restrictions No      Balance Screen   Has the patient fallen in the past 6 months No    Has the patient had a decrease in activity level because of a fear of falling?  No    Is the patient reluctant to leave their home because of a fear of falling?  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     Pelvic Floor Special Questions - 08/02/21 0001     Prior Pregnancies Yes    Number of Pregnancies 4    Number of Vaginal Deliveries 2    Currently Sexually Active Yes    Is this Painful No  Urinary Leakage Yes    Pad use 1 pad during the day and night    Activities that cause leaking With strong urge    Urinary urgency Yes    Urinary frequency after she urinates and wipes she still has leakage    Caffeine beverages soffee in the morning and 1-2 soft drinks through the day    Falling out feeling (prolapse) No    Skin Integrity Intact    Perineal Body/Introitus  Gaping    Pelvic Floor Internal Exam Patient confirms identification and approves PT to assess pelvic floor and treatment    Exam Type Vaginal    Palpation tenderness located on the left bulbocavernosus    Strength Flicker              [facility] Adult PT Treatment/Exercise - 08/02/21 0001       Self-Care   Self-Care Other Self-Care Comments    Other Self-Care Comments  educated patient on how to massage the sides of the introitus to improve the blood flow and mobility of the tissue                     PT Education - 08/02/21 0837     Education Details instructed patient on how to massage the sides of the introitus to improve pelvic floor contraction; discussed with patient  on reducing her caffiene and drinking more water.    Person(s) Educated Patient    Methods Explanation    Comprehension Verbalized understanding              PT Short Term Goals - 08/02/21 0824       PT SHORT TERM GOAL #1   Title independent with initial HEP for pelvic floor strength    Baseline not educated yet    Time 4    Period Weeks    Status New    Target Date 08/30/21               PT Long Term Goals - 08/02/21 0832       PT LONG TERM GOAL #1   Title independent with advanced HEP for core and pelvic floor strengthening    Baseline not educated yet    Time 8    Period Weeks    Status New    Target Date 09/27/21      PT LONG TERM GOAL #2   Title pelvic floor strength >/= 3/5 with good circular contraction and hug of the therapist finger with a lift to reduce urinary leakage >/= 75%    Baseline strength is 1/5    Time 8    Period Weeks    Status New    Target Date 09/27/21      PT LONG TERM GOAL #3   Title Patient is able to hold her urine when she gets a strong urge and walks to the commode due to increased pelvic floor strength >/= 3/5    Baseline unable to hold her urine as she walks to the commode, pelvic floor strength 1/5    Time 8    Period Weeks    Status New    Target Date 09/27/21      PT LONG TERM GOAL #4   Title UIQ-7 score </= 50    Baseline UIQ-7 is 71    Time 8    Period Weeks    Status New    Target Date 09/27/21  Plan - 08/02/21 0837     Clinical Impression Statement Patient is a 45 year old female with stress and urge incontinence for the past year. She is using a thicker pantyliner 2 times per 24 hour period. She will leak urine when she gets the urge to urinate and as she is walking to the commode, and sometimes will just leak urine. Patient will leak urine after she urinates. Pelvic floor strength is 1/5 with no hug of therapist finger. She has tenderness located on the left side of the  bulbocavernosus. Abdominal strength is 2/5.  Her UIQ-7 score is 71. Patient reports she drinks very little water and mostly caffienated beverages. Patient will benefit from skilled therapy to reduce her urinary leakage and improve her pelvic floor coordination.    Personal Factors and Comorbidities Fitness    Examination-Activity Limitations Continence;Toileting    Stability/Clinical Decision Making Stable/Uncomplicated    Clinical Decision Making Low    Rehab Potential Excellent    PT Frequency 1x / week    PT Duration 8 weeks    PT Treatment/Interventions ADLs/Self Care Home Management;Biofeedback;Therapeutic activities;Therapeutic exercise;Neuromuscular re-education;Patient/family education;Manual techniques    PT Next Visit Plan internal work to improve pelvic floor contraction; abdominal strength; see how it is with reducing her caffien    Consulted and Agree with Plan of Care Patient             Patient will benefit from skilled therapeutic intervention in order to improve the following deficits and impairments:  Decreased coordination, Increased fascial restricitons, Decreased strength  Visit Diagnosis: Muscle weakness (generalized) - Plan: PT plan of care cert/re-cert  Other lack of coordination - Plan: PT plan of care cert/re-cert     Problem List Patient Active Problem List   Diagnosis Date Noted   Visit for preventive health examination 12/09/2019   Anxiety and depression 07/13/2019   Seizures ([place]) 03/17/2019   Essential hypertension 03/09/2018   Breast cancer screening 03/09/2018   Cervical cancer screening 03/09/2018   Sacrodynia 03/09/2018    Earlie Counts, PT 08/02/21 8:45 AM  [place] @ [address] [place], [place], [address] Phone: [phone]   Fax:  [phone]  Name: Faith Oconnor MRN: [id number] Date of Birth: [birth year]  PHYSICAL THERAPY DISCHARGE SUMMARY  Visits from Start of  Care: 1  Current functional level related to goals / functional outcomes: See above. Patient has missed several appointments due to health issues and not remembering her appointments. Therapist spoke to patient and she reported she is having a lot of things going on and trouble remembering her appointments. Therapist and her decided for her to be discharged and for her to resume in the future when she can focus on therapy.    Remaining deficits: See above.    Education / Equipment: HEP   Patient agrees to discharge. Patient goals were not met. Patient is being discharged due to the patient's request. Thank you for the referral. Earlie Counts, PT 10/04/21 9:19 AM

## 2021-08-23 ENCOUNTER — Telehealth: Payer: 59 | Admitting: Nurse Practitioner

## 2021-08-23 DIAGNOSIS — J069 Acute upper respiratory infection, unspecified: Secondary | ICD-10-CM | POA: Diagnosis not present

## 2021-08-23 NOTE — Progress Notes (Signed)
Faith Oconnor,  Unless someone around you has tested positive for flu I would not recommend prescription medications for flu since your symptoms may be from another virus. Continue to test for COVID daily, and if you can get a flu test that would also be helpful. In the meantime I recommend:  E visit for Flu like symptoms   We are sorry that you are not feeling well.  Here is how we plan to help! Based on what you have shared with me it looks like you may have flu-like symptoms that should be watched but do not seem to indicate anti-viral treatment.  Influenza or "the flu" is   an infection caused by a respiratory virus. The flu virus is highly contagious and persons who did not receive their yearly flu vaccination may "catch" the flu from close contact.  We have anti-viral medications to treat the viruses that cause this infection. They are not a "cure" and only shorten the course of the infection. These prescriptions are most effective when they are given within the first 2 days of "flu" symptoms. Antiviral medication are indicated if you have a high risk of complications from the flu. You should  also consider an antiviral medication if you are in close contact with someone who is at risk. These medications can help patients avoid complications from the flu  but have side effects that you should know. Possible side effects from Tamiflu or oseltamivir include nausea, vomiting, diarrhea, dizziness, headaches, eye redness, sleep problems or other respiratory symptoms. You should not take Tamiflu if you have an allergy to oseltamivir or any to the ingredients in Tamiflu.  Based upon your symptoms and potential risk factors I recommend that you follow the flu symptoms recommendation that I have listed below.  ANYONE WHO HAS FLU SYMPTOMS SHOULD: Stay home. The flu is highly contagious and going out or to work exposes others! Be sure to drink plenty of fluids. Water is fine as well as fruit juices, sodas  and electrolyte beverages. You may want to stay away from caffeine or alcohol. If you are nauseated, try taking small sips of liquids. How do you know if you are getting enough fluid? Your urine should be a pale yellow or almost colorless. Get rest. Taking a steamy shower or using a humidifier may help nasal congestion and ease sore throat pain. Using a saline nasal spray works much the same way. Cough drops, hard candies and sore throat lozenges may ease your cough. Line up a caregiver. Have someone check on you regularly.   GET HELP RIGHT AWAY IF: You cannot keep down liquids or your medications. You become short of breath Your fell like you are going to pass out or loose consciousness. Your symptoms persist after you have completed your treatment plan MAKE SURE YOU  Understand these instructions. Will watch your condition. Will get help right away if you are not doing well or get worse.  Your e-visit answers were reviewed by a board certified advanced clinical practitioner to complete your personal care plan.  Depending on the condition, your plan could have included both over the counter or prescription medications.  If there is a problem please reply  once you have received a response from your provider.  Your safety is important to [place].  If you have drug allergies check your prescription carefully.    You can use MyChart to ask questions about today's visit, request a non-urgent call back, or ask for a work or school excuse  for 24 hours related to this e-Visit. If it has been greater than 24 hours you will need to follow up with your provider, or enter a new e-Visit to address those concerns.  You will get an e-mail in the next two days asking about your experience.  I hope that your e-visit has been valuable and will speed your recovery. Thank you for using e-visits.   I spent approximately 10 minutes reviewing the patient's history, current symptoms and coordinating their care  today.

## 2021-09-03 ENCOUNTER — Ambulatory Visit (INDEPENDENT_AMBULATORY_CARE_PROVIDER_SITE_OTHER): Payer: 59 | Admitting: Neurology

## 2021-09-03 ENCOUNTER — Encounter: Payer: Self-pay | Admitting: Neurology

## 2021-09-03 ENCOUNTER — Other Ambulatory Visit: Payer: Self-pay

## 2021-09-03 VITALS — BP 136/75 | HR 77 | Ht 63.0 in | Wt 197.0 lb

## 2021-09-03 DIAGNOSIS — R569 Unspecified convulsions: Secondary | ICD-10-CM

## 2021-09-03 MED ORDER — LAMOTRIGINE 100 MG PO TABS: ORAL_TABLET | ORAL | 11 refills | Status: DC
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
## 2021-09-07 LAB — COMPREHENSIVE METABOLIC PANEL
ALT: 9 IU/L (ref 0–32)
AST: 13 IU/L (ref 0–40)
Albumin/Globulin Ratio: 1.8 (ref 1.2–2.2)
Albumin: 4.7 g/dL (ref 3.8–4.8)
Alkaline Phosphatase: 78 IU/L (ref 44–121)
BUN/Creatinine Ratio: 11 (ref 9–23)
BUN: 13 mg/dL (ref 6–24)
Bilirubin Total: 0.3 mg/dL (ref 0.0–1.2)
CO2: 26 mmol/L (ref 20–29)
Calcium: 10.3 mg/dL — ABNORMAL HIGH (ref 8.7–10.2)
Chloride: 99 mmol/L (ref 96–106)
Creatinine, Ser: 1.21 mg/dL — ABNORMAL HIGH (ref 0.57–1.00)
Globulin, Total: 2.6 g/dL (ref 1.5–4.5)
Glucose: 75 mg/dL (ref 70–99)
Potassium: 4.1 mmol/L (ref 3.5–5.2)
Sodium: 141 mmol/L (ref 134–144)
Total Protein: 7.3 g/dL (ref 6.0–8.5)
eGFR: 56 mL/min/{1.73_m2} — ABNORMAL LOW (ref >59)

## 2021-09-07 LAB — RPR: RPR Ser Ql: NONREACTIVE

## 2021-09-07 LAB — VITAMIN B12: Vitamin B-12: 564 pg/mL (ref 232–1245)

## 2021-09-07 LAB — LAMOTRIGINE LEVEL: Lamotrigine Lvl: 12.4 ug/mL (ref 2.0–20.0)

## 2021-09-07 LAB — TSH: TSH: 1.21 u[IU]/mL (ref 0.450–4.500)

## 2021-09-13 ENCOUNTER — Telehealth: Payer: 59 | Admitting: Physician Assistant

## 2021-09-13 ENCOUNTER — Other Ambulatory Visit: Payer: Self-pay | Admitting: Family

## 2021-09-13 ENCOUNTER — Encounter: Payer: 59 | Admitting: Physical Therapy

## 2021-09-13 DIAGNOSIS — R42 Dizziness and giddiness: Secondary | ICD-10-CM

## 2021-09-13 MED ORDER — MECLIZINE HCL 50 MG PO TABS: 50.0000 mg | ORAL_TABLET | Freq: Three times a day (TID) | ORAL | 0 refills | Status: DC | PRN

## 2021-09-13 MED ORDER — MECLIZINE HCL 25 MG PO TABS: 25.0000 mg | ORAL_TABLET | Freq: Three times a day (TID) | ORAL | 1 refills | Status: AC | PRN

## 2021-09-13 NOTE — Addendum Note (Signed)
Addended by: Jannifer Rodney A on: 09/13/2021 02:22 PM   Modules accepted: Orders

## 2021-09-13 NOTE — Progress Notes (Signed)
E-Visit for Upper Respiratory Infection   We are sorry you are not feeling well.  Here is how we plan to help!  Based on what you have shared with me, it looks like you may have vertigo.    I have sent in Antivert 25 mg three times a day as needed.   HOME CARE Only take medications as instructed by your medical team. Be sure to drink plenty of fluids. Water is fine as well as fruit juices, sodas and electrolyte beverages. You may want to stay away from caffeine or alcohol. If you are nauseated, try taking small sips of liquids. How do you know if you are getting enough fluid? Your urine should be a pale yellow or almost colorless. Get rest. Taking a steamy shower or using a humidifier may help nasal congestion and ease sore throat pain. You can place a towel over your head and breathe in the steam from hot water coming from a faucet. Using a saline nasal spray works much the same way. Cough drops, hard candies and sore throat lozenges may ease your cough. Avoid close contacts especially the very young and the elderly Cover your mouth if you cough or sneeze Always remember to wash your hands.   GET HELP RIGHT AWAY IF: You develop worsening fever. If your symptoms do not improve within 10 days You develop yellow or green discharge from your nose over 3 days. You have coughing fits You develop a severe head ache or visual changes. You develop shortness of breath, difficulty breathing or start having chest pain Your symptoms persist after you have completed your treatment plan  MAKE SURE YOU  Understand these instructions. Will watch your condition. Will get help right away if you are not doing well or get worse.  Thank you for choosing an e-visit.  Your e-visit answers were reviewed by a board certified advanced clinical practitioner to complete your personal care plan. Depending upon the condition, your plan could have included both over the counter or prescription  medications.  Please review your pharmacy choice. Make sure the pharmacy is open so you can pick up prescription now. If there is a problem, you may contact your provider through [employer] and have the prescription routed to another pharmacy.  Your safety is important to [place]. If you have drug allergies check your prescription carefully.   For the next 24 hours you can use MyChart to ask questions about today's visit, request a non-urgent call back, or ask for a work or school excuse. You will get an email in the next two days asking about your experience. I hope that your e-visit has been valuable and will speed your recovery.  Approximately 5 minutes was spent documenting and reviewing patient's chart.

## 2021-09-16 ENCOUNTER — Ambulatory Visit: Payer: 59 | Attending: Certified Nurse Midwife | Admitting: Physical Therapy

## 2021-09-16 ENCOUNTER — Telehealth: Payer: Self-pay | Admitting: Physical Therapy

## 2021-09-16 ENCOUNTER — Other Ambulatory Visit: Payer: 59 | Admitting: *Deleted

## 2021-09-16 DIAGNOSIS — M6281 Muscle weakness (generalized): Secondary | ICD-10-CM | POA: Insufficient documentation

## 2021-09-16 DIAGNOSIS — R278 Other lack of coordination: Secondary | ICD-10-CM | POA: Insufficient documentation

## 2021-09-16 NOTE — Telephone Encounter (Signed)
Called patient about her missed appointment today at 8:00. Unable to leave a voice mail due to it not being set up.  Eulis Foster, PT @12 /19/2022@ 8:19 AM

## 2021-09-17 ENCOUNTER — Telehealth: Payer: Self-pay | Admitting: Neurology

## 2021-09-17 ENCOUNTER — Ambulatory Visit: Payer: 59 | Admitting: Neurology

## 2021-09-17 NOTE — Telephone Encounter (Signed)
Pt was called re: an appointment between 09-26-21 and  10/11/2021.  Phone rep received voice message that pt's voicemail is not set up yet.  FYI

## 2021-09-24 ENCOUNTER — Ambulatory Visit: Payer: 59 | Admitting: Neurology

## 2021-09-26 ENCOUNTER — Other Ambulatory Visit: Payer: 59 | Admitting: *Deleted

## 2021-09-26 ENCOUNTER — Ambulatory Visit: Payer: 59 | Admitting: Neurology

## 2021-09-29 ENCOUNTER — Telehealth: Payer: 59 | Admitting: Emergency Medicine

## 2021-09-29 DIAGNOSIS — N898 Other specified noninflammatory disorders of vagina: Secondary | ICD-10-CM | POA: Diagnosis not present

## 2021-09-29 MED ORDER — FLUCONAZOLE 200 MG PO TABS: ORAL_TABLET | ORAL | 0 refills | Status: DC

## 2021-09-29 NOTE — Progress Notes (Signed)
I have spent 5 minutes in review of e-visit questionnaire, review and updating patient chart, medical decision making and response to patient.   Leyani Gargus, PA-C    

## 2021-09-29 NOTE — Progress Notes (Signed)

## 2021-10-04 ENCOUNTER — Ambulatory Visit: Payer: 59 | Attending: Certified Nurse Midwife | Admitting: Physical Therapy

## 2021-10-04 ENCOUNTER — Telehealth: Payer: Self-pay | Admitting: Physical Therapy

## 2021-10-04 DIAGNOSIS — M6281 Muscle weakness (generalized): Secondary | ICD-10-CM | POA: Insufficient documentation

## 2021-10-04 DIAGNOSIS — R278 Other lack of coordination: Secondary | ICD-10-CM | POA: Insufficient documentation

## 2021-10-04 NOTE — Telephone Encounter (Signed)
Called patient about her missed appointment today at 8:45. She forgot about it. Patient reports she has a lot going on and trouble remembering her appointment. Therapist and patient decided she is to be discharged at this time and for her to resume in the future when she is able to focus on her therapy.  Faith Oconnor, PT @1 /02/2022@ 9:16 AM

## 2021-10-08 ENCOUNTER — Ambulatory Visit (INDEPENDENT_AMBULATORY_CARE_PROVIDER_SITE_OTHER): Payer: 59 | Admitting: Neurology

## 2021-10-08 DIAGNOSIS — R569 Unspecified convulsions: Secondary | ICD-10-CM | POA: Diagnosis not present

## 2021-10-11 ENCOUNTER — Encounter: Payer: 59 | Admitting: Physical Therapy

## 2021-10-18 ENCOUNTER — Encounter: Payer: 59 | Admitting: Physical Therapy

## 2021-10-28 NOTE — Procedures (Signed)
° °  HISTORY: 46 year old female, with history of recurrent seizure, memory loss  TECHNIQUE:  This is a routine 16 channel EEG recording with one channel devoted to a limited EKG recording.  It was performed during wakefulness, drowsiness and asleep.  Hyperventilation and photic stimulation were performed as activating procedures.  There are minimum muscle and movement artifact noted.  Upon maximum arousal, posterior dominant waking rhythm consistent of rhythmic alpha range activity, with frequency of 9 hz. Activities are symmetric over the bilateral posterior derivations and attenuated with eye opening.  Hyperventilation produced mild/moderate buildup with higher amplitude and the slower activities noted.  Photic stimulation did not alter the tracing.  During EEG recording, patient developed drowsiness and no deeper stage of sleep was achieved During EEG recording, there was no epileptiform discharge noted.  EKG demonstrate sinus rhythm, with heart rate of 60 bpm  CONCLUSION: This is a  normal awake EEG.  There is no electrodiagnostic evidence of epileptiform discharge.  Levert Feinstein, M.D. Ph.D.  [facility] [address] [place], [place] [address] Phone: [phone] Fax:      [phone]

## 2021-11-19 ENCOUNTER — Telehealth: Payer: Self-pay | Admitting: Neurology

## 2021-11-19 NOTE — Telephone Encounter (Signed)
Sent mychart message to patient letting her know her appointment was pushed out a week due to Dr. Terrace Arabia being off.

## 2022-01-06 ENCOUNTER — Ambulatory Visit: Payer: 59 | Admitting: Neurology

## 2022-01-13 ENCOUNTER — Encounter: Payer: Self-pay | Admitting: Neurology

## 2022-01-13 ENCOUNTER — Ambulatory Visit (INDEPENDENT_AMBULATORY_CARE_PROVIDER_SITE_OTHER): Payer: 59 | Admitting: Neurology

## 2022-01-13 VITALS — BP 117/76 | HR 81 | Ht 63.0 in | Wt 198.0 lb

## 2022-01-13 DIAGNOSIS — F419 Anxiety disorder, unspecified: Secondary | ICD-10-CM | POA: Diagnosis not present

## 2022-01-13 DIAGNOSIS — R569 Unspecified convulsions: Secondary | ICD-10-CM | POA: Diagnosis not present

## 2022-01-13 MED ORDER — LAMOTRIGINE 100 MG PO TABS: 100.0000 mg | ORAL_TABLET | Freq: Two times a day (BID) | ORAL | 3 refills | Status: DC

## 2022-01-13 MED ORDER — LAMOTRIGINE 100 MG PO TABS: ORAL_TABLET | ORAL | 3 refills | Status: DC
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
## 2022-01-31 ENCOUNTER — Telehealth: Payer: 59 | Admitting: Family

## 2022-01-31 DIAGNOSIS — R109 Unspecified abdominal pain: Secondary | ICD-10-CM

## 2022-01-31 DIAGNOSIS — N898 Other specified noninflammatory disorders of vagina: Secondary | ICD-10-CM

## 2022-01-31 DIAGNOSIS — R300 Dysuria: Secondary | ICD-10-CM

## 2022-01-31 NOTE — Progress Notes (Signed)
Based on what you shared with me, I feel your condition warrants further evaluation and I recommend that you be seen in a face to face visit. ? ?Given your stomach pains, UTI symptoms, and vaginal discharge it would be best to be seen in person to rule out a more serious infection.  ?  ?NOTE: There will be NO CHARGE for this eVisit ?  ?If you are having a true medical emergency please call 911.   ?  ? For an urgent face to face visit, [facility] has six urgent care centers for your convenience:  ?  ? [facility] ?Get Driving Directions ?[phone] ?[phone] ?[place], [place] [address] ?  ? [facility]) ?Get Driving Directions ?[phone] ?[address] ?[place], [place] [address] ? ?[facility]) ?Get Driving Directions ?[phone] ?[address] ?[place],  [place]  [address] ? ?[facility] ?Get Driving Directions ?[phone] ?[address], [address] ?[place], [place] [address] ?  ?[facility] [place] ?Get Driving Directions  ?[phone] ?[address] ?[address] ?[place], [place] [address] ?  ? Ur[facility] at [facility] ?Get Driving Directions ?[phone] ?[address] ?[place], [place] [address] ? ?Your MyChart E-visit questionnaire answers were reviewed by a board certified advanced clinical practitioner to complete your personal care plan based on your specific symptoms.  Thank you for using e-Visits. ?  ? ?

## 2022-03-18 ENCOUNTER — Other Ambulatory Visit: Payer: Self-pay

## 2022-03-18 ENCOUNTER — Emergency Department (HOSPITAL_BASED_OUTPATIENT_CLINIC_OR_DEPARTMENT_OTHER): Payer: 59 | Admitting: Radiology

## 2022-03-18 ENCOUNTER — Encounter (HOSPITAL_BASED_OUTPATIENT_CLINIC_OR_DEPARTMENT_OTHER): Payer: Self-pay | Admitting: Emergency Medicine

## 2022-03-18 ENCOUNTER — Emergency Department (HOSPITAL_BASED_OUTPATIENT_CLINIC_OR_DEPARTMENT_OTHER)
Admission: EM | Admit: 2022-03-18 | Discharge: 2022-03-18 | Disposition: A | Discharge status: Home / Self Care | Payer: 59 | Attending: Emergency Medicine | Admitting: Emergency Medicine

## 2022-03-18 DIAGNOSIS — M546 Pain in thoracic spine: Secondary | ICD-10-CM | POA: Insufficient documentation

## 2022-03-18 DIAGNOSIS — I10 Essential (primary) hypertension: Secondary | ICD-10-CM | POA: Insufficient documentation

## 2022-03-18 DIAGNOSIS — M62838 Other muscle spasm: Secondary | ICD-10-CM | POA: Insufficient documentation

## 2022-03-18 MED ORDER — LIDOCAINE 5 % EX PTCH: 1.0000 | MEDICATED_PATCH | CUTANEOUS | 0 refills | Status: DC

## 2022-03-18 MED ORDER — LISINOPRIL-HYDROCHLOROTHIAZIDE 10-12.5 MG PO TABS: 1.0000 | ORAL_TABLET | Freq: Every day | ORAL | 0 refills | Status: DC

## 2022-03-18 MED ORDER — CYCLOBENZAPRINE HCL 10 MG PO TABS: 10.0000 mg | ORAL_TABLET | Freq: Two times a day (BID) | ORAL | 0 refills | Status: AC | PRN

## 2022-03-18 NOTE — ED Triage Notes (Signed)
Pt arrives to ED with c/o x3 weeks of left sided mid back pain.

## 2022-03-18 NOTE — Discharge Instructions (Signed)
Your history, exam, work-up today were overall reassuring.  I suspect of a musculoskeletal spasm and pain causing your discomfort.  Please fill the prescriptions and use them and rest and stay hydrated.  Please be careful use the medications as they can make you drowsy.  Please follow-up with your primary doctor.  Please use the blood pressure medicine as well.  If any symptoms change or worsen acutely, please return to the nearest emergency department.

## 2022-03-18 NOTE — ED Provider Notes (Signed)
[facility] EMERGENCY DEPT Provider Note   CSN: [id number] Arrival date & time: 03/18/22  1617     History  Chief Complaint  Patient presents with   Back Pain    Paola P Fatzinger is a 46 y.o. female.  The history is provided by the patient and medical records. No language interpreter was used.  Back Pain Location:  Thoracic spine Quality:  Aching Radiates to:  Does not radiate Pain severity:  Moderate Pain is:  Unable to specify Onset quality:  Gradual Duration:  3 weeks Timing:  Constant Progression:  Waxing and waning Chronicity:  New Relieved by:  Nothing Worsened by:  Nothing Ineffective treatments:  None tried Associated symptoms: no abdominal pain, no chest pain, no dysuria, no fever, no headaches, no leg pain, no numbness, no paresthesias and no weakness        Home Medications Prior to Admission medications   Medication Sig Start Date End Date Taking? Authorizing Provider  albuterol (VENTOLIN HFA) 108 (90 Base) MCG/ACT inhaler Inhale 2 puffs into the lungs every 6 (six) hours as needed for wheezing or shortness of breath. 10/15/20   Junie Spencer, FNP  baclofen (LIORESAL) 10 MG tablet Take 1 tablet (10 mg total) by mouth 3 (three) times daily. 07/23/21   Junie Spencer, FNP  cyclobenzaprine (FLEXERIL) 10 MG tablet Take 1 tablet (10 mg total) by mouth 3 (three) times daily as needed for muscle spasms. 10/10/19   Waldon Merl, PA-C  fluconazole (DIFLUCAN) 200 MG tablet Take one dose by mouth, wait 72 hours, and then take second dose by mouth 09/29/21   Wurst, [place], PA-C  fluticasone (FLONASE) 50 MCG/ACT nasal spray Place 2 sprays into both nostrils daily. 10/15/20   Junie Spencer, FNP  lamoTRIgine (LAMICTAL) 100 MG tablet Take 1 tablet (100 mg total) by mouth 2 (two) times daily. 01/13/22   Levert Feinstein, MD  levonorgestrel (MIRENA) 20 MCG/24HR IUD 1 each by Intrauterine route once.    [provider]   lisinopril-hydrochlorothiazide (ZESTORETIC) 10-12.5 MG tablet Take 1 tablet by mouth daily. 12/13/19   Waldon Merl, PA-C  LORazepam (ATIVAN) 0.5 MG tablet Take 1 tablet (0.5 mg total) by mouth 2 (two) times daily as needed for anxiety. 09/17/20   Waldon Merl, PA-C  Magnesium Oxide (MAG-OXIDE) 200 MG TABS Take 2 tablets (400 mg total) by mouth at bedtime. If that amount causes loose stools in the am, switch to 200mg  daily at bedtime. 04/24/21   04/26/21, CNM  meclizine (ANTIVERT) 25 MG tablet Take 1 tablet (25 mg total) by mouth 3 (three) times daily as needed for dizziness. 09/13/21   09/15/21, FNP  naproxen (NAPROSYN) 500 MG tablet Take 1 tablet (500 mg total) by mouth 2 (two) times daily with a meal. 07/23/21   Hawks, 07/25/21 A, FNP  triamcinolone (KENALOG) 0.025 % cream Apply 1 application topically 2 (two) times daily as needed (for itching).     [provider]      Allergies    Penicillins and Clindamycin    Review of Systems   Review of Systems  Constitutional:  Negative for chills, fatigue and fever.  HENT:  Negative for congestion.   Respiratory:  Negative for cough, chest tightness, shortness of breath and wheezing.   Cardiovascular:  Negative for chest pain and palpitations.  Gastrointestinal:  Negative for abdominal pain, constipation, diarrhea, nausea and vomiting.  Genitourinary:  Negative for dysuria, flank pain and frequency.  Musculoskeletal:  Positive for back pain. Negative for neck pain and neck stiffness.  Skin:  Negative for rash and wound.  Neurological:  Negative for dizziness, weakness, light-headedness, numbness, headaches and paresthesias.  Psychiatric/Behavioral:  Negative for agitation and confusion.   All other systems reviewed and are negative.   Physical Exam Updated Vital Signs BP (!) 142/71 (BP Location: Right Arm)   Pulse 79   Temp 98.1 F (36.7 C) (Oral)   Resp 18   Ht 5\' 3"  (1.6 m)   Wt 88 kg   SpO2 100%    BMI 34.37 kg/m  Physical Exam Vitals and nursing note reviewed.  Constitutional:      General: She is not in acute distress.    Appearance: She is well-developed. She is not ill-appearing, toxic-appearing or diaphoretic.  HENT:     Head: Normocephalic and atraumatic.  Eyes:     Extraocular Movements: Extraocular movements intact.     Conjunctiva/sclera: Conjunctivae normal.     Pupils: Pupils are equal, round, and reactive to light.  Cardiovascular:     Rate and Rhythm: Normal rate and regular rhythm.     Heart sounds: No murmur heard. Pulmonary:     Effort: Pulmonary effort is normal. No respiratory distress.     Breath sounds: Normal breath sounds. No wheezing, rhonchi or rales.  Chest:     Chest wall: No tenderness.  Abdominal:     General: Abdomen is flat.     Palpations: Abdomen is soft.     Tenderness: There is no abdominal tenderness. There is no right CVA tenderness, left CVA tenderness, guarding or rebound.  Musculoskeletal:        General: Tenderness present. No swelling.     Cervical back: Neck supple. No rigidity or tenderness.     Thoracic back: Spasms and tenderness present. No signs of trauma.       Back:     Right lower leg: No edema.     Left lower leg: No edema.  Skin:    General: Skin is warm and dry.     Capillary Refill: Capillary refill takes less than 2 seconds.     Findings: No erythema or rash.  Neurological:     General: No focal deficit present.     Mental Status: She is alert.     Sensory: No sensory deficit.     Motor: No weakness.  Psychiatric:        Mood and Affect: Mood normal.     ED Results / Procedures / Treatments   Labs (all labs ordered are listed, but only abnormal results are displayed) Labs Reviewed - No data to display  EKG None  Radiology DG Thoracic Spine 2 View  Result Date: 03/18/2022 CLINICAL DATA:  Left back pain. EXAM: THORACIC SPINE 2 VIEWS COMPARISON:  None Available. FINDINGS: There is no evidence of  thoracic spine fracture. Alignment is normal. No other significant bone abnormalities are identified. Mild thoracic spondylosis. Mild cervical spondylosis. IMPRESSION: No acute osseous abnormality. Cervicothoracic spondylosis Electronically Signed   By: 03/20/2022 M.D.   On: 03/18/2022 17:46   DG Chest 2 View  Result Date: 03/18/2022 CLINICAL DATA:  Left mid back pain near scapula. EXAM: CHEST - 2 VIEW COMPARISON:  None Available at time of dictation. FINDINGS: The heart size and mediastinal contours are within normal limits. No focal airspace consolidation. No pleural effusion. No pneumothorax. No acute osseous abnormality. IMPRESSION: No acute cardiopulmonary disease. Electronically Signed   By: 03/20/2022  Caprice Beaver M.D.   On: 03/18/2022 17:45    Procedures Procedures    Medications Ordered in ED Medications - No data to display  ED Course/ Medical Decision Making/ A&P                           Medical Decision Making Amount and/or Complexity of Data Reviewed Radiology: ordered.  Risk Prescription drug management.    Myelle P Mckown is a 46 y.o. female with a past medical history significant for hypertension, seizures, anxiety, depression, and previous back pain who presents with left back pain.  According to patient, for the last 3 weeks she has had pain in her left mid back near her paraspinal area/scapula.  She reports it is more painful when she twists and bends certain ways.  She cannot lay on her left side due to the pain at times.  She reports she does not always do heavy lifting.  Denies any acute trauma.  Denies any urinary changes such as dysuria, hematuria, or frequency.  Denies any constipation or diarrhea.  No anterior abdominal pain at this time.  No chest pain or shortness of breath.  Discomfort is nonpleuritic.  No symptoms in her legs.  No loss of bowel or bladder control.  No other complaints.  Reports the pain is moderate to severe at times and feels like spasms and  waxing and waning aching.  On exam, lungs clear and chest nontender.  Abdomen nontender.  Flanks nontender.  Patient does have some tenderness on her left mid back in the paraspinal area near the scapula.  She does have some palpable spasms.  No midline tenderness.  No neck tenderness.  No focal neurologic deficits.  Patient otherwise well-appearing.  Had a shared decision made conversation with patient.  Given her symptoms and exam I am most concerned about muscle spasm and musculoskeletal pains.  We will get an x-ray of the chest and thoracic spine.  If there is no significant abnormality seen, anticipate discharge for outpatient medical management with likely Lidoderm and Flexeril and have her follow-up with outpatient PCP.  Patient agrees with this plan, dissipate reassessment after imaging.   Imaging was reviewed and interpreted by me.  No evidence of acute fracture or dislocation seen.  Patient likely has muscle spasm and musculoskeletal pain.  She agrees with plan to use muscle relaxant and Lidoderm patches.  She also requested lisinopril refill which was given.  She will follow-up with her primary doctor and understood return precautions.  She no other questions or concerns and was discharged in good condition.         Final Clinical Impression(s) / ED Diagnoses Final diagnoses:  Muscle spasm  Left-sided thoracic back pain, unspecified chronicity    Rx / DC Orders ED Discharge Orders          Ordered    cyclobenzaprine (FLEXERIL) 10 MG tablet  2 times daily PRN        03/18/22 1937    lidocaine (LIDODERM) 5 %  Every 24 hours        03/18/22 1937    lisinopril-hydrochlorothiazide (ZESTORETIC) 10-12.5 MG tablet  Daily        03/18/22 1937            Clinical Impression: 1. Muscle spasm   2. Left-sided thoracic back pain, unspecified chronicity     Disposition: Discharge  Condition: Good  I have discussed the results, Dx and Tx plan with  the pt(& family if  present). He/she/they expressed understanding and agree(s) with the plan. Discharge instructions discussed at great length. Strict return precautions discussed and pt &/or family have verbalized understanding of the instructions. No further questions at time of discharge.    New Prescriptions   CYCLOBENZAPRINE (FLEXERIL) 10 MG TABLET    Take 1 tablet (10 mg total) by mouth 2 (two) times daily as needed for muscle spasms.   LIDOCAINE (LIDODERM) 5 %    Place 1 patch onto the skin daily. Remove & Discard patch within 12 hours or as directed by MD   LISINOPRIL-HYDROCHLOROTHIAZIDE (ZESTORETIC) 10-12.5 MG TABLET    Take 1 tablet by mouth daily.    Follow Up: [facility] [address] [place] [address] [place] [address] [phone] Schedule an appointment as soon as possible for a visit    [facility] [address] [place] [address] [phone]       Kaitlen Redford, Canary Brim, MD 03/18/22 [phone]

## 2022-03-18 NOTE — ED Notes (Signed)
Patient transported to X-ray 

## 2022-07-16 ENCOUNTER — Telehealth: Payer: 59 | Admitting: Family

## 2022-07-16 DIAGNOSIS — J019 Acute sinusitis, unspecified: Secondary | ICD-10-CM | POA: Diagnosis not present

## 2022-07-16 MED ORDER — KETOCONAZOLE 2 % EX CREA: 1.0000 | TOPICAL_CREAM | Freq: Every day | CUTANEOUS | 0 refills | Status: DC

## 2022-07-16 MED ORDER — DOXYCYCLINE HYCLATE 100 MG PO TABS: 100.0000 mg | ORAL_TABLET | Freq: Two times a day (BID) | ORAL | 0 refills | Status: DC

## 2022-07-16 NOTE — Addendum Note (Signed)
Addended by: Evelina Dun A on: 07/16/2022 07:23 PM   Modules accepted: Orders

## 2022-07-16 NOTE — Progress Notes (Signed)
Please void previous message.  E-Visit for Sinus Problems  We are sorry that you are not feeling well.  Here is how we plan to help!  Based on what you have shared with me it looks like you have sinusitis.  Sinusitis is inflammation and infection in the sinus cavities of the head.  Based on your presentation I believe you most likely have Acute Bacterial Sinusitis.  This is an infection caused by bacteria and is treated with antibiotics. I have prescribed Doxycycline 100mg  by mouth twice a day for 10 days. You may use an oral decongestant such as Mucinex D or if you have glaucoma or high blood pressure use plain Mucinex. Saline nasal spray help and can safely be used as often as needed for congestion.  If you develop worsening sinus pain, fever or notice severe headache and vision changes, or if symptoms are not better after completion of antibiotic, please schedule an appointment with a health care provider.    Sinus infections are not as easily transmitted as other respiratory infection, however we still recommend that you avoid close contact with loved ones, especially the very young and elderly.  Remember to wash your hands thoroughly throughout the day as this is the number one way to prevent the spread of infection!  Home Care: Only take medications as instructed by your medical team. Complete the entire course of an antibiotic. Do not take these medications with alcohol. A steam or ultrasonic humidifier can help congestion.  You can place a towel over your head and breathe in the steam from hot water coming from a faucet. Avoid close contacts especially the very young and the elderly. Cover your mouth when you cough or sneeze. Always remember to wash your hands.  Get Help Right Away If: You develop worsening fever or sinus pain. You develop a severe head ache or visual changes. Your symptoms persist after you have completed your treatment plan.  Make sure you Understand these  instructions. Will watch your condition. Will get help right away if you are not doing well or get worse.  Thank you for choosing an e-visit.  Your e-visit answers were reviewed by a board certified advanced clinical practitioner to complete your personal care plan. Depending upon the condition, your plan could have included both over the counter or prescription medications.  Please review your pharmacy choice. Make sure the pharmacy is open so you can pick up prescription now. If there is a problem, you may contact your provider through [employer] and have the prescription routed to another pharmacy.  Your safety is important to [place]. If you have drug allergies check your prescription carefully.   For the next 24 hours you can use MyChart to ask questions about today's visit, request a non-urgent call back, or ask for a work or school excuse. You will get an email in the next two days asking about your experience. I hope that your e-visit has been valuable and will speed your recovery.  Approximately 5 minutes was spent documenting and reviewing patient's chart.

## 2022-07-16 NOTE — Progress Notes (Signed)
E Visit for Rash   We are sorry that you are not feeling well. Here is how we plan to help!   It appears you have angular cheilitis. I have sent in a fungal cream you apply to the corner of your mouth twice a day.      HOME CARE:   Take cool showers and avoid direct sunlight. Apply cool compress or wet dressings. Take a bath in an oatmeal bath.  Sprinkle content of one Aveeno packet under running faucet with comfortably warm water.  Bathe for 15-20 minutes, 1-2 times daily.  Pat dry with a towel. Do not rub the rash. Use hydrocortisone cream. Take an antihistamine like Benadryl for widespread rashes that itch.  The adult dose of Benadryl is 25-50 mg by mouth 4 times daily. Caution:  This type of medication may cause sleepiness.  Do not drink alcohol, drive, or operate dangerous machinery while taking antihistamines.  Do not take these medications if you have prostate enlargement.  Read package instructions thoroughly on all medications that you take.   GET HELP RIGHT AWAY IF:   Symptoms don't go away after treatment. Severe itching that persists. If you rash spreads or swells. If you rash begins to smell. If it blisters and opens or develops a yellow-brown crust. You develop a fever. You have a sore throat. You become short of breath.   MAKE SURE YOU:   Understand these instructions. Will watch your condition. Will get help right away if you are not doing well or get worse.   Thank you for choosing an e-visit.   Your e-visit answers were reviewed by a board certified advanced clinical practitioner to complete your personal care plan. Depending upon the condition, your plan could have included both over the counter or prescription medications.   Please review your pharmacy choice. Make sure the pharmacy is open so you can pick up prescription now. If there is a problem, you may contact your provider through MyChart messaging and have the prescription routed to another pharmacy.   Your safety is important to us. If you have drug allergies check your prescription carefully.    For the next 24 hours you can use MyChart to ask questions about today's visit, request a non-urgent call back, or ask for a work or school excuse. You will get an email in the next two days asking about your experience. I hope that your e-visit has been valuable and will speed your recovery.   Approximately 5 minutes was spent documenting and reviewing patient's chart.   

## 2022-08-24 ENCOUNTER — Telehealth: Payer: Commercial Managed Care - HMO | Admitting: Nurse Practitioner

## 2022-08-24 DIAGNOSIS — N760 Acute vaginitis: Secondary | ICD-10-CM

## 2022-08-24 DIAGNOSIS — B9689 Other specified bacterial agents as the cause of diseases classified elsewhere: Secondary | ICD-10-CM | POA: Diagnosis not present

## 2022-08-24 MED ORDER — METRONIDAZOLE 0.75 % EX GEL: 1.0000 | Freq: Every evening | CUTANEOUS | 0 refills | Status: AC

## 2022-08-24 NOTE — Progress Notes (Signed)
I have spent 5 minutes in review of e-visit questionnaire, review and updating patient chart, medical decision making and response to patient.  ° °Tequilla Cousineau W [place], NP ° °  °

## 2022-08-24 NOTE — Progress Notes (Signed)

## 2022-08-28 ENCOUNTER — Other Ambulatory Visit: Payer: Self-pay | Admitting: Nurse Practitioner

## 2022-08-28 DIAGNOSIS — Z1231 Encounter for screening mammogram for malignant neoplasm of breast: Secondary | ICD-10-CM

## 2022-08-29 DIAGNOSIS — M25512 Pain in left shoulder: Secondary | ICD-10-CM | POA: Diagnosis not present

## 2022-09-05 ENCOUNTER — Ambulatory Visit: Payer: Commercial Managed Care - HMO

## 2022-09-27 DIAGNOSIS — M25512 Pain in left shoulder: Secondary | ICD-10-CM | POA: Diagnosis not present

## 2022-10-06 DIAGNOSIS — M25512 Pain in left shoulder: Secondary | ICD-10-CM | POA: Diagnosis not present

## 2022-10-31 ENCOUNTER — Ambulatory Visit
Admission: RE | Admit: 2022-10-31 | Discharge: 2022-10-31 | Disposition: A | Discharge status: Home / Self Care | Payer: Commercial Managed Care - HMO | Source: Ambulatory Visit | Attending: Nurse Practitioner | Admitting: Nurse Practitioner

## 2022-10-31 DIAGNOSIS — Z1231 Encounter for screening mammogram for malignant neoplasm of breast: Secondary | ICD-10-CM | POA: Diagnosis not present

## 2022-11-26 DIAGNOSIS — F419 Anxiety disorder, unspecified: Secondary | ICD-10-CM | POA: Diagnosis not present

## 2022-11-26 DIAGNOSIS — R7989 Other specified abnormal findings of blood chemistry: Secondary | ICD-10-CM | POA: Diagnosis not present

## 2022-11-26 DIAGNOSIS — F32A Depression, unspecified: Secondary | ICD-10-CM | POA: Diagnosis not present

## 2022-11-26 DIAGNOSIS — B001 Herpesviral vesicular dermatitis: Secondary | ICD-10-CM | POA: Diagnosis not present

## 2022-11-26 DIAGNOSIS — R413 Other amnesia: Secondary | ICD-10-CM | POA: Diagnosis not present
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
## 2023-01-14 ENCOUNTER — Encounter: Payer: Self-pay | Admitting: Neurology

## 2023-01-14 ENCOUNTER — Ambulatory Visit: Payer: Medicaid Other | Admitting: Neurology

## 2023-01-14 VITALS — BP 128/80 | HR 62 | Ht 63.0 in | Wt 207.5 lb

## 2023-01-14 DIAGNOSIS — R569 Unspecified convulsions: Secondary | ICD-10-CM

## 2023-01-14 DIAGNOSIS — M25561 Pain in right knee: Secondary | ICD-10-CM | POA: Diagnosis not present

## 2023-01-14 MED ORDER — LAMOTRIGINE 100 MG PO TABS: 100.0000 mg | ORAL_TABLET | Freq: Two times a day (BID) | ORAL | 3 refills | Status: DC

## 2023-01-14 NOTE — Patient Instructions (Signed)
Great to see you today! Continue Lamictal 100 mg twice a day Check labs Call for any seizures, otherwise see you in 1 year :)

## 2023-01-26 ENCOUNTER — Telehealth: Payer: Self-pay | Admitting: Neurology

## 2023-01-26 DIAGNOSIS — F419 Anxiety disorder, unspecified: Secondary | ICD-10-CM

## 2023-01-26 DIAGNOSIS — R569 Unspecified convulsions: Secondary | ICD-10-CM

## 2023-01-26 NOTE — Telephone Encounter (Signed)
Called and relayed recommendations of dr. Terrace Arabia and she voiced understanding that someone will be calling her to schedule eeg and she voiced understanding

## 2023-01-26 NOTE — Addendum Note (Signed)
Addended by: Levert Feinstein on: 01/26/2023 02:16 PM   Modules accepted: Orders

## 2023-01-26 NOTE — Telephone Encounter (Signed)
Called patient and she is concerned that her memory is lapsing and she is concerned because she is having trouble keeping jobs. She has just lost another one. She believes this is related to the Lamictal and Zoloft (she gets from her PCP) interacting and causing memory problems. She states Dr. Terrace Arabia did look into this and scans and labs came back normal and they discussed lowering the lamictal even more since it had been since 2020 her last seizure. She wants to know if this is still an option, I asked if she had spoken to her PCP about the zoloft and she says that she has and they advised to talk to [place] about the Lamictal. She states she has been on lamictal for 2 yrs and zoloft for 5 yrs. She was just seen on 04.17.24 and she states that there are no new symptoms, she just wants to know if something can be done so she can keep a job.

## 2023-01-26 NOTE — Telephone Encounter (Signed)
Orders Placed This Encounter  Procedures   EEG adult   I have ordered repeat EEG,  keep current dose  of lamotrigine.  Please keep follow up with psychiatrist

## 2023-01-26 NOTE — Telephone Encounter (Signed)
Pt is asking for a call to discuss questions she has about her lamoTRIgine (LAMICTAL) 100 MG tablet ,

## 2023-02-03 ENCOUNTER — Telehealth: Payer: Self-pay

## 2023-02-03 DIAGNOSIS — F32A Depression, unspecified: Secondary | ICD-10-CM | POA: Diagnosis not present

## 2023-02-03 DIAGNOSIS — R413 Other amnesia: Secondary | ICD-10-CM | POA: Diagnosis not present

## 2023-02-03 DIAGNOSIS — F419 Anxiety disorder, unspecified: Secondary | ICD-10-CM | POA: Diagnosis not present

## 2023-02-03 NOTE — Telephone Encounter (Signed)
Call to patient to advise on medication compliance and needing labs.Suggested pill box to help remember to take medications. Patient verbalized understanding and will come by today to have labs drawn.

## 2023-02-12 ENCOUNTER — Ambulatory Visit: Payer: Medicaid Other | Admitting: Neurology

## 2023-02-12 DIAGNOSIS — F419 Anxiety disorder, unspecified: Secondary | ICD-10-CM

## 2023-02-12 DIAGNOSIS — R569 Unspecified convulsions: Secondary | ICD-10-CM | POA: Diagnosis not present

## 2023-02-16 NOTE — Procedures (Signed)
   HISTORY: 47 year old female with history of epilepsy  TECHNIQUE:  This is a routine 16 channel EEG recording with one channel devoted to a limited EKG recording.  It was performed during wakefulness, drowsiness and asleep.  Hyperventilation and photic stimulation were performed as activating procedures.  There are minimum muscle and movement artifact noted.  Upon maximum arousal, posterior dominant waking rhythm consistent of rhythmic alpha range activity. Activities are symmetric over the bilateral posterior derivations and attenuated with eye opening.  Photic stimulation did not alter the tracing.  Hyperventilation produced mild/moderate buildup with higher amplitude and the slower activities noted.  During EEG recording, patient developed drowsiness and no deeper stage of sleep was achieved During EEG recording, there was no epileptiform discharge noted.  EKG demonstrate normal sinus rhythm.  CONCLUSION: This is a  normal awake EEG.  There is no electrodiagnostic evidence of epileptiform discharge.  Levert Feinstein, M.D. Ph.D.  [facility] [address] [place], [place] [address] Phone: [phone] Fax:      [phone]

## 2023-02-24 ENCOUNTER — Encounter (INDEPENDENT_AMBULATORY_CARE_PROVIDER_SITE_OTHER): Payer: Medicaid Other | Admitting: Neurology

## 2023-02-24 ENCOUNTER — Ambulatory Visit
Admission: EM | Admit: 2023-02-24 | Discharge: 2023-02-24 | Disposition: A | Discharge status: Home / Self Care | Payer: Medicaid Other

## 2023-02-24 ENCOUNTER — Telehealth: Payer: Commercial Managed Care - HMO | Admitting: Nurse Practitioner

## 2023-02-24 ENCOUNTER — Other Ambulatory Visit: Payer: Self-pay

## 2023-02-24 DIAGNOSIS — J329 Chronic sinusitis, unspecified: Secondary | ICD-10-CM | POA: Diagnosis not present

## 2023-02-24 DIAGNOSIS — R42 Dizziness and giddiness: Secondary | ICD-10-CM | POA: Diagnosis not present

## 2023-02-24 DIAGNOSIS — B9689 Other specified bacterial agents as the cause of diseases classified elsewhere: Secondary | ICD-10-CM

## 2023-02-24 DIAGNOSIS — R569 Unspecified convulsions: Secondary | ICD-10-CM | POA: Diagnosis not present

## 2023-02-24 MED ORDER — DOXYCYCLINE HYCLATE 100 MG PO CAPS: 100.0000 mg | ORAL_CAPSULE | Freq: Two times a day (BID) | ORAL | 0 refills | Status: AC

## 2023-02-24 NOTE — ED Triage Notes (Signed)
Pt presents to UC w/ c/o dizziness, sinus pressure, lightheadedness, nausea, and nasal drainage x2 weeks.

## 2023-02-24 NOTE — Progress Notes (Signed)
Because of your recent procedure, your neck pain and dizziness, I feel your condition warrants further evaluation and I recommend that you be seen in a face to face visit.   NOTE: There will be NO CHARGE for this eVisit   If you are having a true medical emergency please call 911.      For an urgent face to face visit, [place] has eight urgent care centers for your convenience:   NEW!! [facility] at [facility] Get Driving Directions [phone] [address], [address] [place], [address]    [facility] Health Urgent Care Center at [facility] Get Driving Directions [phone] [address] [place], [place] [address]   [facility] Health Urgent Care Center [facility] Get Driving Directions [phone] [address] [place], [place] [address]  [facility] Health Urgent Care Center [facility]) Get Driving Directions [phone] [address] [place],  [place]  [address]  [facility] Health Urgent Care Center [facility] - at [employer]  [phone] [address] [place],  [place] [address]   [facility] Health Urgent Care at [facility] Get Driving Directions [phone] [address] [place], [place] [address]   [facility] Health Urgent Care at [facility] Get Driving Directions  [phone] [address] [place], [place] [address]   [facility] Health Urgent Care at [facility] Directions [phone] [address] [place], [place] [address]  Your MyChart E-visit questionnaire answers were reviewed by a board certified advanced clinical practitioner to complete your personal care plan based on your specific symptoms.  Thank you for using e-Visits.

## 2023-02-24 NOTE — Discharge Instructions (Signed)
Start doxycycline twice daily for 7 days Continue Flonase and nasal rinses as needed You may continue meclizine as needed for your vertigo symptoms Please follow-up with your PCP at your scheduled appointment this week Please go to the emergency room if you develop any worsening symptoms

## 2023-02-24 NOTE — ED Provider Notes (Signed)
[facility]    CSN: [id number] Arrival date & time: 02/24/23  1206      History   Chief Complaint Chief Complaint  Patient presents with   Dizziness    Entered by patient    HPI Faith Oconnor is a 47 y.o. female  presents for evaluation of URI symptoms for 7 days. Patient reports associated symptoms of headache, sinus pressure/pain, vertigo/dizziness, nausea. Denies vomiting, diarrhea, fevers, sore throat, ear pain, body aches, shortness of breath, syncope. patient does not have a hx of asthma or smoking. No known sick contacts.  Patient endorses history of vertigo and states her dizziness she is currently experiencing is consistent with her previous vertigo episodes.  Describes as room spinning sensation that occurs only with head turning or position change.  Patient also has a history of seizures and recently had been started on Viibryd.  She states since been on the medication she has had 2 seizures.  When she reviewed the side effect profile seizures was one of them.  At the suggestion of her pharmacist she has stopped this and has not been on it for the past 2 days.  She does have an appoint with her PCP in 2 days.  Patient has had a recent normal EEG and brain MRI.  Denies any photophobia, vomiting, nuchal rigidity.  She does states she has had some muscular type neck pain that has improved since switching to a more supportive pillow.  Pt has taken Tylenol and ibuprofen OTC for symptoms. Pt has no other concerns at this time.    Dizziness Associated symptoms: nausea     Past Medical History:  Diagnosis Date   Anxiety    Hypertension    Low back strain, subsequent encounter 05/17/2018   Seizures [facility])     Patient Active Problem List   Diagnosis Date Noted   Anxiety 01/13/2022   Visit for preventive health examination 12/09/2019   Anxiety and depression 07/13/2019   Seizures (HCC) 03/17/2019   Essential hypertension 03/09/2018   Breast cancer screening  03/09/2018   Cervical cancer screening 03/09/2018   Sacrodynia 03/09/2018    Past Surgical History:  Procedure Laterality Date   NO PAST SURGERIES      OB History     Gravida  5   Para  2   Term      Preterm      AB  2   Living  2      SAB  1   IAB  1   Ectopic      Multiple      Live Births  2            Home Medications    Prior to Admission medications   Medication Sig Start Date End Date Taking? Authorizing Provider  doxycycline (VIBRAMYCIN) 100 MG capsule Take 1 capsule (100 mg total) by mouth 2 (two) times daily for 7 days. 02/24/23 03/03/23 Yes Radford Pax, NP  albuterol (VENTOLIN HFA) 108 (90 Base) MCG/ACT inhaler Inhale 2 puffs into the lungs every 6 (six) hours as needed for wheezing or shortness of breath. 10/15/20   Junie Spencer, FNP  cyclobenzaprine (FLEXERIL) 10 MG tablet Take 1 tablet (10 mg total) by mouth 3 (three) times daily as needed for muscle spasms. 10/10/19   Waldon Merl, PA-C  cyclobenzaprine (FLEXERIL) 10 MG tablet Take 1 tablet (10 mg total) by mouth 2 (two) times daily as needed for muscle spasms. 03/18/22  Tegeler, Canary Brim, MD  fluticasone (FLONASE) 50 MCG/ACT nasal spray Place 2 sprays into both nostrils daily. 10/15/20   Junie Spencer, FNP  lamoTRIgine (LAMICTAL) 100 MG tablet Take 1 tablet (100 mg total) by mouth 2 (two) times daily. 01/14/23   Glean Salvo, NP  levonorgestrel (MIRENA) 20 MCG/24HR IUD 1 each by Intrauterine route once.    [provider]  LORazepam (ATIVAN) 0.5 MG tablet Take 1 tablet (0.5 mg total) by mouth 2 (two) times daily as needed for anxiety. 09/17/20   Waldon Merl, PA-C  Magnesium Oxide (MAG-OXIDE) 200 MG TABS Take 2 tablets (400 mg total) by mouth at bedtime. If that amount causes loose stools in the am, switch to 200mg  daily at bedtime. 04/24/21   Bernerd Limbo, CNM  meclizine (ANTIVERT) 25 MG tablet Take 1 tablet (25 mg total) by mouth 3 (three) times daily as needed  for dizziness. 09/13/21   Jannifer Rodney A, FNP  triamcinolone (KENALOG) 0.025 % cream Apply 1 application topically 2 (two) times daily as needed (for itching).     [provider]  VIIBRYD 10 MG TABS Take by mouth.    [provider]    Family History Family History  Problem Relation Age of Onset   Hypertension Mother    Osteoarthritis Mother    Hyperlipidemia Mother    Hypertension Father    Diabetes Mellitus II Father    Hyperlipidemia Father    Hypertension Sister    Sleep apnea Sister    Depression Sister    Breast cancer Maternal Grandmother 20   Breast cancer Maternal Aunt 30       Metastasized to her bones   Pancreatic cancer Maternal Aunt    Depression Sister    Hypertension Sister    Epilepsy Daughter    Migraines Daughter     Social History Social History   Tobacco Use   Smoking status: Never   Smokeless tobacco: Never  Vaping Use   Vaping Use: Never used  Substance Use Topics   Alcohol use: No   Drug use: No     Allergies   Penicillin g, Amoxicillin, Clindamycin, and Penicillins   Review of Systems Review of Systems  Constitutional:  Negative for fever.  HENT:  Positive for congestion, sinus pressure and sinus pain.   Gastrointestinal:  Positive for nausea.  Neurological:  Positive for dizziness.     Physical Exam Triage Vital Signs ED Triage Vitals [02/24/23 1221]  Enc Vitals Group     BP (!) 140/90     Pulse Rate 89     Resp 16     Temp 98.6 F (37 C)     Temp Source Oral     SpO2 99 %     Weight      Height      Head Circumference      Peak Flow      Pain Score      Pain Loc      Pain Edu?      Excl. in GC?    No data found.  Updated Vital Signs BP (!) 140/90 (BP Location: Left Arm)   Pulse 89   Temp 98.6 F (37 C) (Oral)   Resp 16   SpO2 99%   Visual Acuity Right Eye Distance:   Left Eye Distance:   Bilateral Distance:    Right Eye Near:   Left Eye Near:    Bilateral Near:  Physical  Exam Vitals and nursing note reviewed.  Constitutional:      General: She is not in acute distress.    Appearance: Normal appearance. She is well-developed. She is not ill-appearing, toxic-appearing or diaphoretic.  HENT:     Head: Normocephalic and atraumatic.     Right Ear: Tympanic membrane and ear canal normal.     Left Ear: Tympanic membrane and ear canal normal.     Nose: Congestion present.     Right Turbinates: Swollen and pale.     Left Turbinates: Swollen and pale.     Right Sinus: Maxillary sinus tenderness present. No frontal sinus tenderness.     Left Sinus: Maxillary sinus tenderness present. No frontal sinus tenderness.     Mouth/Throat:     Mouth: Mucous membranes are moist.     Pharynx: Oropharynx is clear. Uvula midline. No oropharyngeal exudate or posterior oropharyngeal erythema.     Tonsils: No tonsillar exudate or tonsillar abscesses.  Eyes:     Conjunctiva/sclera: Conjunctivae normal.     Pupils: Pupils are equal, round, and reactive to light.  Neck:     Comments: Mild tenderness to palpation to bilateral paraspinal cervical muscles that extends to bilateral trapezius muscles.  Strength is 5 out of 5 bilateral upper extremities. Cardiovascular:     Rate and Rhythm: Normal rate and regular rhythm.     Heart sounds: Normal heart sounds.  Pulmonary:     Effort: Pulmonary effort is normal.     Breath sounds: Normal breath sounds.  Musculoskeletal:     Cervical back: Normal range of motion and neck supple. No erythema, signs of trauma, rigidity, torticollis or crepitus. Muscular tenderness present. No pain with movement or spinous process tenderness. Normal range of motion.  Lymphadenopathy:     Cervical: No cervical adenopathy.  Skin:    General: Skin is warm and dry.  Neurological:     General: No focal deficit present.     Mental Status: She is alert and oriented to person, place, and time.     GCS: GCS eye subscore is 4. GCS verbal subscore is 5. GCS motor  subscore is 6.     Cranial Nerves: No facial asymmetry.     Motor: No weakness.     Coordination: Romberg sign negative. Finger-Nose-Finger Test normal.  Psychiatric:        Mood and Affect: Mood normal.        Behavior: Behavior normal.      UC Treatments / Results  Labs (all labs ordered are listed, but only abnormal results are displayed) Labs Reviewed - No data to display  EKG   Radiology No results found.  Procedures Procedures (including critical care time)  Medications Ordered in UC Medications - No data to display  Initial Impression / Assessment and Plan / UC Course  I have reviewed the triage vital signs and the nursing notes.  Pertinent labs & imaging results that were available during my care of the patient were reviewed by me and considered in my medical decision making (see chart for details).     Reviewed exam and symptoms with patient. Start doxycycline for sinusitis.  She will continue Flonase and nasal rinses as needed Patient reports dizziness is the same presentation as previous vertigo symptoms.  No red flags on exam.   Patient denies headache as the worst headache of her life.  Patient has taken meclizine in the past and will continue as needed for vertigo symptoms Patient follow-up  with her PCP at her scheduled appointment in 2 days.  Also strongly encourage she make her PCP aware that she has stopped her Viibryd Strict ER precautions reviewed and patient verbalized understanding Final Clinical Impressions(s) / UC Diagnoses   Final diagnoses:  Bacterial sinusitis  Vertigo     Discharge Instructions      Start doxycycline twice daily for 7 days Continue Flonase and nasal rinses as needed You may continue meclizine as needed for your vertigo symptoms Please follow-up with your PCP at your scheduled appointment this week Please go to the emergency room if you develop any worsening symptoms   ED Prescriptions     Medication Sig Dispense  Auth. Provider   doxycycline (VIBRAMYCIN) 100 MG capsule Take 1 capsule (100 mg total) by mouth 2 (two) times daily for 7 days. 14 capsule Radford Pax, NP      PDMP not reviewed this encounter.   Radford Pax, NP 02/24/23 1310

## 2023-02-24 NOTE — Telephone Encounter (Signed)
Please see the MyChart message reply(ies) for my assessment and plan.    This patient gave consent for this Medical Advice Message and is aware that it may result in a bill to [employer], as well as the possibility of receiving a bill for a co-payment or deductible. They are an established patient, but are not seeking medical advice exclusively about a problem treated during an in person or video visit in the last seven days. I did not recommend an in person or video visit within seven days of my reply.    I spent a total of 7 minutes cumulative time within 7 days through [employer].  Glean Salvo, NP    Patient sent message reporting 2 seizures after starting Viibryd.  She has stopped the medication.  She will continue Lamictal.  Keep me posted.  No driving until seizure-free 6 months.

## 2023-02-26 DIAGNOSIS — Z0000 Encounter for general adult medical examination without abnormal findings: Secondary | ICD-10-CM | POA: Diagnosis not present

## 2023-02-26 DIAGNOSIS — F419 Anxiety disorder, unspecified: Secondary | ICD-10-CM | POA: Diagnosis not present

## 2023-02-26 DIAGNOSIS — Z1211 Encounter for screening for malignant neoplasm of colon: Secondary | ICD-10-CM | POA: Diagnosis not present

## 2023-02-26 DIAGNOSIS — R42 Dizziness and giddiness: Secondary | ICD-10-CM | POA: Diagnosis not present

## 2023-02-26 DIAGNOSIS — R413 Other amnesia: Secondary | ICD-10-CM | POA: Diagnosis not present

## 2023-02-26 DIAGNOSIS — Z1159 Encounter for screening for other viral diseases: Secondary | ICD-10-CM | POA: Diagnosis not present

## 2023-03-10 ENCOUNTER — Telehealth: Payer: Medicaid Other | Admitting: Physician Assistant

## 2023-03-10 DIAGNOSIS — B001 Herpesviral vesicular dermatitis: Secondary | ICD-10-CM

## 2023-03-10 MED ORDER — VALACYCLOVIR HCL 1 G PO TABS: 2000.0000 mg | ORAL_TABLET | Freq: Two times a day (BID) | ORAL | 0 refills | Status: AC

## 2023-03-10 NOTE — Progress Notes (Signed)
We are sorry that you are not feeling well.  Here is how we plan to help!  Based on what you have shared with me it does look like you have a viral infection.    Most cold sores or fever blisters are small fluid filled blisters around the mouth caused by herpes simplex virus.  The most common strain of the virus causing cold sores is herpes simplex virus 1.  It can be spread by skin contact, sharing eating utensils, or even sharing towels.  Cold sores are contagious to other people until dry. (Approximately 5-7 days).  Wash your hands. You can spread the virus to your eyes through handling your contact lenses after touching the lesions.  Most people experience pain at the sight or tingling sensations in their lips that may begin before the ulcers erupt.  Herpes simplex is treatable but not curable.  It may lie dormant for a long time and then reappear due to stress or prolonged sun exposure.  Many patients have success in treating their cold sores with an over the counter topical called Abreva.  You may apply the cream up to 5 times daily (maximum 10 days) until healing occurs.  If you would like to use an oral antiviral medication to speed the healing of your cold sore, I have sent a prescription to your local pharmacy Valacyclovir 2 gm take one by mouth twice a day for 1 day    HOME CARE:  Wash your hands frequently. Do not pick at or rub the sore. Don't open the blisters. Avoid kissing other people during this time. Avoid sharing drinking glasses, eating utensils, or razors. Do not handle contact lenses unless you have thoroughly washed your hands with soap and warm water! Avoid oral sex during this time.  Herpes from sores on your mouth can spread to your partner's genital area. Avoid contact with anyone who has eczema or a weakened immune system. Cold sores are often triggered by exposure to intense sunlight, use a lip balm containing a sunscreen (SPF 30 or higher).  GET HELP RIGHT AWAY  IF:  Blisters look infected. Blisters occur near or in the eye. Symptoms last longer than 10 days. Your symptoms become worse.  MAKE SURE YOU:  Understand these instructions. Will watch your condition. Will get help right away if you are not doing well or get worse.    Your e-visit answers were reviewed by a board certified advanced clinical practitioner to complete your personal care plan.  Depending upon the condition, your plan could have  Included both over the counter or prescription medications.    Please review your pharmacy choice.  Be sure that the pharmacy you have chosen is open so that you can pick up your prescription now.  If there is a problem you can message your provider in MyChart to have the prescription routed to another pharmacy.    Your safety is important to us.  If you have drug allergies check our prescription carefully.  For the next 24 hours you can use MyChart to ask questions about today's visit, request a non-urgent call back, or ask for a work or school excuse from your e-visit provider.  You will get an email in the next two days asking about your experience.  I hope that your e-visit has been valuable and will speed your recovery.  I have spent 5 minutes in review of e-visit questionnaire, review and updating patient chart, medical decision making and response to patient.     Mercer Peifer M Catalea Labrecque, PA-C  

## 2023-04-20 DIAGNOSIS — Z20822 Contact with and (suspected) exposure to covid-19: Secondary | ICD-10-CM | POA: Diagnosis not present

## 2023-04-20 DIAGNOSIS — R531 Weakness: Secondary | ICD-10-CM | POA: Diagnosis not present

## 2023-05-25 ENCOUNTER — Telehealth: Payer: Self-pay | Admitting: Neurology

## 2023-05-25 NOTE — Telephone Encounter (Signed)
LVM and sent mychart msg informing pt of need to reschedule 01/14/24 appt - NP out

## 2023-06-10 DIAGNOSIS — R42 Dizziness and giddiness: Secondary | ICD-10-CM | POA: Diagnosis not present

## 2023-07-16 DIAGNOSIS — M5412 Radiculopathy, cervical region: Secondary | ICD-10-CM | POA: Diagnosis not present

## 2023-07-16 DIAGNOSIS — X58XXXA Exposure to other specified factors, initial encounter: Secondary | ICD-10-CM | POA: Diagnosis not present

## 2023-07-16 DIAGNOSIS — S29012A Strain of muscle and tendon of back wall of thorax, initial encounter: Secondary | ICD-10-CM | POA: Diagnosis not present

## 2023-09-18 DIAGNOSIS — F411 Generalized anxiety disorder: Secondary | ICD-10-CM | POA: Diagnosis not present

## 2023-09-18 DIAGNOSIS — N760 Acute vaginitis: Secondary | ICD-10-CM | POA: Diagnosis not present

## 2023-09-18 DIAGNOSIS — N898 Other specified noninflammatory disorders of vagina: Secondary | ICD-10-CM | POA: Diagnosis not present

## 2023-09-18 DIAGNOSIS — B9689 Other specified bacterial agents as the cause of diseases classified elsewhere: Secondary | ICD-10-CM | POA: Diagnosis not present

## 2023-09-18 DIAGNOSIS — S46812A Strain of other muscles, fascia and tendons at shoulder and upper arm level, left arm, initial encounter: Secondary | ICD-10-CM | POA: Diagnosis not present

## 2023-12-17 ENCOUNTER — Telehealth: Payer: Self-pay | Admitting: Neurology

## 2023-12-17 NOTE — Telephone Encounter (Signed)
 Pt would like a call back to discuss changing medication due to side effects from lamoTRIgine (LAMICTAL) 100 MG tablet

## 2023-12-17 NOTE — Telephone Encounter (Signed)
 Please advise her that we can discuss at her next appointment in office. I would continue current dosing. Thanks

## 2023-12-17 NOTE — Telephone Encounter (Signed)
 Call back to patient, is in agreement to discuss at next visit

## 2023-12-17 NOTE — Telephone Encounter (Signed)
 Call to patient, she reports no seizure since 06/2019 and would like to discuss lowering dose or stopping due to the confusion it causes it her and no seizures. Advised I would send to sarah to review. She reports no rash with medication at this time.

## 2024-01-07 DIAGNOSIS — U071 COVID-19: Secondary | ICD-10-CM | POA: Diagnosis not present

## 2024-01-14 ENCOUNTER — Ambulatory Visit: Payer: Medicaid Other | Admitting: Neurology

## 2024-01-19 ENCOUNTER — Encounter: Payer: Self-pay | Admitting: Neurology

## 2024-01-19 ENCOUNTER — Telehealth: Payer: Self-pay

## 2024-01-19 ENCOUNTER — Ambulatory Visit: Payer: Medicaid Other | Admitting: Neurology

## 2024-01-19 VITALS — BP 145/84 | HR 94 | Ht 63.0 in | Wt 193.5 lb

## 2024-01-19 DIAGNOSIS — F419 Anxiety disorder, unspecified: Secondary | ICD-10-CM

## 2024-01-19 DIAGNOSIS — R413 Other amnesia: Secondary | ICD-10-CM | POA: Diagnosis not present

## 2024-01-19 DIAGNOSIS — R569 Unspecified convulsions: Secondary | ICD-10-CM

## 2024-01-19 MED ORDER — LAMOTRIGINE ER 200 MG PO TB24: 200.0000 mg | ORAL_TABLET | Freq: Every day | ORAL | 11 refills | Status: DC
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
## 2024-01-19 NOTE — Telephone Encounter (Signed)
  EMAILED TO SCHEDULING AT [facility]

## 2024-01-19 NOTE — Telephone Encounter (Signed)
-----   Message from Wess Hammed sent at 01/19/2024  4:53 PM EDT ----- Can we complete 72 hour video EEG form

## 2024-01-19 NOTE — Patient Instructions (Addendum)
 Check MRI brain, check labs Switch to Lamictal  XR 200 mg at bedtime Referral to neuro psych for evaluation of memory  If memory issues continue despite the above, please let me know Check 72 hour Video EEG Follow up with primary care about mood disorder  Follow up here in 6 months

## 2024-01-20 ENCOUNTER — Encounter: Payer: Self-pay | Admitting: Neurology

## 2024-01-20 ENCOUNTER — Telehealth: Payer: Self-pay | Admitting: Neurology

## 2024-01-20 LAB — COMPREHENSIVE METABOLIC PANEL WITH GFR
ALT: 9 IU/L (ref 0–32)
AST: 13 IU/L (ref 0–40)
Albumin: 4.5 g/dL (ref 3.9–4.9)
Alkaline Phosphatase: 81 IU/L (ref 44–121)
BUN/Creatinine Ratio: 13 (ref 9–23)
BUN: 15 mg/dL (ref 6–24)
Bilirubin Total: 0.2 mg/dL (ref 0.0–1.2)
CO2: 26 mmol/L (ref 20–29)
Calcium: 9.4 mg/dL (ref 8.7–10.2)
Chloride: 103 mmol/L (ref 96–106)
Creatinine, Ser: 1.18 mg/dL — ABNORMAL HIGH (ref 0.57–1.00)
Globulin, Total: 2.7 g/dL (ref 1.5–4.5)
Glucose: 77 mg/dL (ref 70–99)
Potassium: 4.2 mmol/L (ref 3.5–5.2)
Sodium: 142 mmol/L (ref 134–144)
Total Protein: 7.2 g/dL (ref 6.0–8.5)
eGFR: 57 mL/min/{1.73_m2} — ABNORMAL LOW (ref >59)

## 2024-01-20 LAB — CBC WITH DIFFERENTIAL/PLATELET
Basophils Absolute: 0 10*3/uL (ref 0.0–0.2)
Basos: 1 %
EOS (ABSOLUTE): 0.1 10*3/uL (ref 0.0–0.4)
Eos: 1 %
Hematocrit: 37.5 % (ref 34.0–46.6)
Hemoglobin: 12 g/dL (ref 11.1–15.9)
Immature Grans (Abs): 0 10*3/uL (ref 0.0–0.1)
Immature Granulocytes: 0 %
Lymphocytes Absolute: 2.3 10*3/uL (ref 0.7–3.1)
Lymphs: 35 %
MCH: 26.3 pg — ABNORMAL LOW (ref 26.6–33.0)
MCHC: 32 g/dL (ref 31.5–35.7)
MCV: 82 fL (ref 79–97)
Monocytes Absolute: 0.9 10*3/uL (ref 0.1–0.9)
Monocytes: 13 %
Neutrophils Absolute: 3.2 10*3/uL (ref 1.4–7.0)
Neutrophils: 50 %
Platelets: 312 10*3/uL (ref 150–450)
RBC: 4.57 x10E6/uL (ref 3.77–5.28)
RDW: 12.9 % (ref 11.7–15.4)
WBC: 6.4 10*3/uL (ref 3.4–10.8)

## 2024-01-20 LAB — LAMOTRIGINE LEVEL: Lamotrigine Lvl: 14.2 ug/mL (ref 2.0–20.0)

## 2024-01-20 NOTE — Progress Notes (Signed)
 Chart reviewed, agree above plan ?

## 2024-01-20 NOTE — Telephone Encounter (Signed)
 Referral for neuropsychology fax to [facility]. [phone], Fax: [phone]

## 2024-01-25 ENCOUNTER — Telehealth: Payer: Self-pay | Admitting: Neurology

## 2024-01-25 ENCOUNTER — Telehealth: Payer: Self-pay | Admitting: Family Medicine

## 2024-01-25 NOTE — Telephone Encounter (Signed)
 I reached out to the patient too get her rescheduled and once I introduced myself she hung up on me.

## 2024-01-25 NOTE — Telephone Encounter (Signed)
 [facility] [place]: [phone] exp. 01/25/24-03/24/24 sent to GI [phone]

## 2024-01-26 DIAGNOSIS — M1712 Unilateral primary osteoarthritis, left knee: Secondary | ICD-10-CM | POA: Diagnosis not present

## 2024-02-09 ENCOUNTER — Inpatient Hospital Stay: Admission: RE | Admit: 2024-02-09 | Source: Ambulatory Visit

## 2024-02-09 ENCOUNTER — Ambulatory Visit: Admitting: Certified Nurse Midwife

## 2024-02-10 ENCOUNTER — Ambulatory Visit: Admitting: Certified Nurse Midwife

## 2024-02-29 ENCOUNTER — Other Ambulatory Visit

## 2024-05-27 ENCOUNTER — Telehealth: Admitting: Family Medicine

## 2024-05-27 DIAGNOSIS — B3731 Acute candidiasis of vulva and vagina: Secondary | ICD-10-CM | POA: Diagnosis not present

## 2024-05-27 MED ORDER — FLUCONAZOLE 150 MG PO TABS
150.0000 mg | ORAL_TABLET | Freq: Every day | ORAL | 0 refills | Status: AC
Start: 2024-05-27 — End: 2024-05-28

## 2024-05-27 NOTE — Progress Notes (Signed)

## 2024-06-06 ENCOUNTER — Other Ambulatory Visit: Payer: Self-pay | Admitting: Nurse Practitioner

## 2024-06-06 DIAGNOSIS — Z1231 Encounter for screening mammogram for malignant neoplasm of breast: Secondary | ICD-10-CM

## 2024-06-10 ENCOUNTER — Encounter

## 2024-06-10 DIAGNOSIS — Z1231 Encounter for screening mammogram for malignant neoplasm of breast: Secondary | ICD-10-CM

## 2024-06-24 DIAGNOSIS — J40 Bronchitis, not specified as acute or chronic: Secondary | ICD-10-CM | POA: Diagnosis not present

## 2024-06-24 DIAGNOSIS — J329 Chronic sinusitis, unspecified: Secondary | ICD-10-CM | POA: Diagnosis not present

## 2024-07-08 ENCOUNTER — Ambulatory Visit
Admission: RE | Admit: 2024-07-08 | Discharge: 2024-07-08 | Disposition: A | Discharge status: Home / Self Care | Source: Ambulatory Visit | Attending: Nurse Practitioner | Admitting: Nurse Practitioner

## 2024-07-08 DIAGNOSIS — Z1231 Encounter for screening mammogram for malignant neoplasm of breast: Secondary | ICD-10-CM

## 2024-07-21 DIAGNOSIS — F419 Anxiety disorder, unspecified: Secondary | ICD-10-CM | POA: Diagnosis not present

## 2024-07-21 DIAGNOSIS — E66811 Obesity, class 1: Secondary | ICD-10-CM | POA: Diagnosis not present

## 2024-07-21 DIAGNOSIS — Z0000 Encounter for general adult medical examination without abnormal findings: Secondary | ICD-10-CM | POA: Diagnosis not present

## 2024-07-21 DIAGNOSIS — Z23 Encounter for immunization: Secondary | ICD-10-CM | POA: Diagnosis not present

## 2024-07-21 DIAGNOSIS — Z6834 Body mass index (BMI) 34.0-34.9, adult: Secondary | ICD-10-CM | POA: Diagnosis not present

## 2024-08-01 ENCOUNTER — Encounter (HOSPITAL_BASED_OUTPATIENT_CLINIC_OR_DEPARTMENT_OTHER): Payer: Self-pay

## 2024-08-19 ENCOUNTER — Encounter (HOSPITAL_COMMUNITY): Payer: Self-pay | Admitting: Emergency Medicine

## 2024-08-19 ENCOUNTER — Emergency Department (HOSPITAL_COMMUNITY)

## 2024-08-19 ENCOUNTER — Emergency Department (HOSPITAL_COMMUNITY)
Admission: EM | Admit: 2024-08-19 | Discharge: 2024-08-19 | Disposition: A | Discharge status: Home / Self Care | Attending: Emergency Medicine | Admitting: Emergency Medicine

## 2024-08-19 DIAGNOSIS — R29810 Facial weakness: Secondary | ICD-10-CM | POA: Diagnosis not present

## 2024-08-19 DIAGNOSIS — R55 Syncope and collapse: Secondary | ICD-10-CM | POA: Diagnosis not present

## 2024-08-19 DIAGNOSIS — R110 Nausea: Secondary | ICD-10-CM | POA: Insufficient documentation

## 2024-08-19 DIAGNOSIS — I10 Essential (primary) hypertension: Secondary | ICD-10-CM | POA: Diagnosis not present

## 2024-08-19 DIAGNOSIS — R61 Generalized hyperhidrosis: Secondary | ICD-10-CM | POA: Diagnosis not present

## 2024-08-19 DIAGNOSIS — R42 Dizziness and giddiness: Secondary | ICD-10-CM | POA: Diagnosis not present

## 2024-08-19 DIAGNOSIS — R569 Unspecified convulsions: Secondary | ICD-10-CM | POA: Diagnosis not present

## 2024-08-19 DIAGNOSIS — Y92410 Unspecified street and highway as the place of occurrence of the external cause: Secondary | ICD-10-CM | POA: Insufficient documentation

## 2024-08-19 DIAGNOSIS — R410 Disorientation, unspecified: Secondary | ICD-10-CM | POA: Diagnosis not present

## 2024-08-19 LAB — COMPREHENSIVE METABOLIC PANEL WITH GFR
ALT: 12 U/L (ref 0–44)
AST: 17 U/L (ref 15–41)
Albumin: 3.7 g/dL (ref 3.5–5.0)
Alkaline Phosphatase: 61 U/L (ref 38–126)
Anion gap: 10 (ref 5–15)
BUN: 8 mg/dL (ref 6–20)
CO2: 25 mmol/L (ref 22–32)
Calcium: 9 mg/dL (ref 8.9–10.3)
Chloride: 104 mmol/L (ref 98–111)
Creatinine, Ser: 1.03 mg/dL — ABNORMAL HIGH (ref 0.44–1.00)
GFR, Estimated: >60 mL/min (ref >60)
Glucose, Bld: 84 mg/dL (ref 70–99)
Potassium: 4 mmol/L (ref 3.5–5.1)
Sodium: 139 mmol/L (ref 135–145)
Total Bilirubin: 0.5 mg/dL (ref 0.0–1.2)
Total Protein: 7.3 g/dL (ref 6.5–8.1)

## 2024-08-19 LAB — CBG MONITORING, ED: Glucose-Capillary: 99 mg/dL (ref 70–99)

## 2024-08-19 LAB — CBC WITH DIFFERENTIAL/PLATELET
Abs Immature Granulocytes: 0.01 K/uL (ref 0.00–0.07)
Basophils Absolute: 0 K/uL (ref 0.0–0.1)
Basophils Relative: 1 %
Eosinophils Absolute: 0.2 K/uL (ref 0.0–0.5)
Eosinophils Relative: 4 %
HCT: 40.5 % (ref 36.0–46.0)
Hemoglobin: 12.8 g/dL (ref 12.0–15.0)
Immature Granulocytes: 0 %
Lymphocytes Relative: 28 %
Lymphs Abs: 1.6 K/uL (ref 0.7–4.0)
MCH: 26.1 pg (ref 26.0–34.0)
MCHC: 31.6 g/dL (ref 30.0–36.0)
MCV: 82.7 fL (ref 80.0–100.0)
Monocytes Absolute: 0.6 K/uL (ref 0.1–1.0)
Monocytes Relative: 11 %
Neutro Abs: 3.3 K/uL (ref 1.7–7.7)
Neutrophils Relative %: 56 %
Platelets: 299 K/uL (ref 150–400)
RBC: 4.9 MIL/uL (ref 3.87–5.11)
RDW: 14.4 % (ref 11.5–15.5)
WBC: 5.9 K/uL (ref 4.0–10.5)
nRBC: 0 % (ref 0.0–0.2)

## 2024-08-19 LAB — HCG, SERUM, QUALITATIVE: Preg, Serum: NEGATIVE
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
## 2024-08-19 NOTE — ED Notes (Addendum)
 Disregard
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
## 2024-08-30 ENCOUNTER — Encounter (HOSPITAL_BASED_OUTPATIENT_CLINIC_OR_DEPARTMENT_OTHER): Admitting: Obstetrics and Gynecology
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
## 2024-09-08 ENCOUNTER — Ambulatory Visit: Admitting: Neurology

## 2024-09-19 ENCOUNTER — Telehealth: Payer: Self-pay | Admitting: Neurology

## 2024-09-19 NOTE — Telephone Encounter (Signed)
 Patient would like a call back to discuss side effect to LamoTRIgine  200 MG TB24 24 hour tablet . Having experienced some memory loss for 6 months.

## 2024-09-20 ENCOUNTER — Ambulatory Visit: Admitting: Neurology

## 2024-09-20 ENCOUNTER — Telehealth: Payer: Self-pay | Admitting: Neurology

## 2024-09-20 ENCOUNTER — Encounter: Payer: Self-pay | Admitting: Neurology

## 2024-09-20 VITALS — BP 110/70 | HR 84 | Ht 63.0 in | Wt 189.5 lb

## 2024-09-20 DIAGNOSIS — F32A Depression, unspecified: Secondary | ICD-10-CM

## 2024-09-20 DIAGNOSIS — F419 Anxiety disorder, unspecified: Secondary | ICD-10-CM

## 2024-09-20 DIAGNOSIS — R413 Other amnesia: Secondary | ICD-10-CM | POA: Diagnosis not present

## 2024-09-20 DIAGNOSIS — R569 Unspecified convulsions: Secondary | ICD-10-CM | POA: Diagnosis not present

## 2024-09-20 MED ORDER — LAMOTRIGINE ER 200 MG PO TB24: 200.0000 mg | ORAL_TABLET | Freq: Every day | ORAL | 3 refills | Status: AC
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
## 2024-09-20 NOTE — Telephone Encounter (Signed)
Referral ophthalmology fax to [facility]. Phone: [phone], Fax: [phone]

## 2024-09-20 NOTE — Patient Instructions (Signed)
 Referral for sleep consult  Referral to eye doctor  Continue Lamictal  for seizure prevention  Check 72 hour EEG for subclinical seizure Check MRI brain  Check Lamictal  level  Follow up with your primary about low blood sugar  Call for seizure activity  Return in 6 months

## 2024-09-21 ENCOUNTER — Telehealth: Payer: Self-pay | Admitting: Neurology

## 2024-09-21 ENCOUNTER — Telehealth: Payer: Self-pay | Admitting: *Deleted

## 2024-09-21 NOTE — Telephone Encounter (Signed)
 Per note on 09/20/24  -72 hour video EEG for evaluation of memory loss   I faxed order to [phone] and emailed order to  Confirmation received via fax.

## 2024-09-21 NOTE — Telephone Encounter (Signed)
 healthy blue shara: [phone] exp. 09/21/24-12/19/24 sent to [facility] [phone]

## 2024-09-26 ENCOUNTER — Encounter: Payer: Self-pay | Admitting: Neurology
